# Patient Record
Sex: Female | Born: 1985
Health system: Southern US, Community
[De-identification: ages and names within clinical notes are randomized; demographics above are authoritative.]

## PROBLEM LIST (undated history)

## (undated) DIAGNOSIS — Z87442 Personal history of urinary calculi: Secondary | ICD-10-CM

## (undated) DIAGNOSIS — F419 Anxiety disorder, unspecified: Secondary | ICD-10-CM

## (undated) DIAGNOSIS — B009 Herpesviral infection, unspecified: Secondary | ICD-10-CM

## (undated) DIAGNOSIS — F32A Depression, unspecified: Secondary | ICD-10-CM

## (undated) DIAGNOSIS — E039 Hypothyroidism, unspecified: Secondary | ICD-10-CM

## (undated) DIAGNOSIS — N189 Chronic kidney disease, unspecified: Secondary | ICD-10-CM

## (undated) DIAGNOSIS — E162 Hypoglycemia, unspecified: Secondary | ICD-10-CM

## (undated) DIAGNOSIS — E785 Hyperlipidemia, unspecified: Secondary | ICD-10-CM

## (undated) DIAGNOSIS — D219 Benign neoplasm of connective and other soft tissue, unspecified: Secondary | ICD-10-CM

## (undated) HISTORY — PX: OTHER SURGICAL HISTORY: SHX169

## (undated) HISTORY — DX: Hyperlipidemia, unspecified: E78.5

## (undated) HISTORY — PX: LEEP: SHX91

## (undated) HISTORY — DX: Hypoglycemia, unspecified: E16.2

## (undated) NOTE — *Deleted (*Deleted)
    Ariel PANGILINAN  09/27/2020      Your procedure is scheduled on  Report to [facility]  at  A.M.  Call this number if you have problems the morning of surgery:[phone]  OUR ADDRESS IS [address], WE ARE LOCATED IN THE [place] [place] WITH [facility].   Remember:  Do not eat food or drink liquids after midnight.  Take these medicines the morning of surgery with A SIP OF WATER   Do not wear jewelry, make-up or nail polish.  Do not wear lotions, powders, or perfumes, or deoderant.  Do not shave 48 hours prior to surgery.  Men may shave face and neck.  Do not bring valuables to the hospital.  [facility] is not responsible for any belongings or valuables.  Contacts, dentures or bridgework may not be worn into surgery.  Leave your suitcase in the car.  After surgery it may be brought to your room.  For patients admitted to the hospital, discharge time will be determined by your treatment team.  Patients discharged the day of surgery will not be allowed to drive home.   Special instructions:  ***  Please read over the following fact sheets that you were given:

---
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
## 2004-01-26 ENCOUNTER — Other Ambulatory Visit: Admission: RE | Admit: 2004-01-26 | Discharge: 2004-01-26 | Payer: Self-pay | Admitting: Obstetrics and Gynecology

## 2005-06-01 ENCOUNTER — Emergency Department (HOSPITAL_COMMUNITY): Admission: EM | Admit: 2005-06-01 | Discharge: 2005-06-01 | Payer: Self-pay | Admitting: Family Medicine

## 2005-11-05 ENCOUNTER — Emergency Department (HOSPITAL_COMMUNITY): Admission: EM | Admit: 2005-11-05 | Discharge: 2005-11-05 | Payer: Self-pay | Admitting: Family Medicine

## 2006-04-20 ENCOUNTER — Emergency Department (HOSPITAL_COMMUNITY): Admission: EM | Admit: 2006-04-20 | Discharge: 2006-04-20 | Payer: Self-pay | Admitting: Family Medicine

## 2006-11-08 ENCOUNTER — Emergency Department (HOSPITAL_COMMUNITY): Admission: EM | Admit: 2006-11-08 | Discharge: 2006-11-08 | Payer: Self-pay | Admitting: Emergency Medicine

## 2015-05-17 ENCOUNTER — Telehealth: Payer: Self-pay | Admitting: Internal Medicine

## 2015-05-17 NOTE — Telephone Encounter (Signed)
Received records from [facility] for appointment with Dr Debara Pickett on 07/05/15.  Records given to [place] [facility] (medical records) for Dr Lysbeth Penner schedule on 07/05/15. lp

## 2015-07-05 ENCOUNTER — Ambulatory Visit (INDEPENDENT_AMBULATORY_CARE_PROVIDER_SITE_OTHER): Payer: BLUE CROSS/BLUE SHIELD | Admitting: Internal Medicine

## 2015-07-05 VITALS — BP 124/92 | HR 71 | Ht 62.0 in | Wt 137.1 lb

## 2015-07-05 DIAGNOSIS — E785 Hyperlipidemia, unspecified: Secondary | ICD-10-CM

## 2015-07-05 MED ORDER — OMEGA-3-ACID ETHYL ESTERS 1 G PO CAPS: 2.0000 g | ORAL_CAPSULE | Freq: Two times a day (BID) | ORAL | Status: DC

## 2015-07-05 NOTE — Patient Instructions (Signed)
Your physician has recommended you make the following change in your medication: START lovaza 2 capsules twice daily.   >> if you start on this medication, it is recommended that your PCP recheck your cholesterol in about 3 months  Your physician recommends that you schedule a follow-up appointment as needed.   Food Choices to Lower Your Triglycerides  Triglycerides are a type of fat in your blood. High levels of triglycerides can increase the risk of heart disease and stroke. If your triglyceride levels are high, the foods you eat and your eating habits are very important. Choosing the right foods can help lower your triglycerides.  WHAT GENERAL GUIDELINES DO I NEED TO FOLLOW?  Lose weight if you are overweight.   Limit or avoid alcohol.   Fill one half of your plate with vegetables and green salads.   Limit fruit to two servings a day. Choose fruit instead of juice.   Make one fourth of your plate whole grains. Look for the word "whole" as the first word in the ingredient list.  Fill one fourth of your plate with lean protein foods.  Enjoy fatty fish (such as salmon, mackerel, sardines, and tuna) three times a week.   Choose healthy fats.   Limit foods high in starch and sugar.  Eat more home-cooked food and less restaurant, buffet, and fast food.  Limit fried foods.  Cook foods using methods other than frying.  Limit saturated fats.  Check ingredient lists to avoid foods with partially hydrogenated oils (trans fats) in them. WHAT FOODS CAN I EAT?  Grains Whole grains, such as whole wheat or whole grain breads, crackers, cereals, and pasta. Unsweetened oatmeal, bulgur, barley, quinoa, or brown rice. Corn or whole wheat flour tortillas.  Vegetables Fresh or frozen vegetables (raw, steamed, roasted, or grilled). Green salads. Fruits All fresh, canned (in natural juice), or frozen fruits. Meat and Other Protein Products Ground beef (85% or leaner), grass-fed beef, or  beef trimmed of fat. Skinless chicken or [place]. Ground chicken or [place]. Pork trimmed of fat. All fish and seafood. Eggs. Dried beans, peas, or lentils. Unsalted nuts or seeds. Unsalted canned or dry beans. Dairy Low-fat dairy products, such as skim or 1% milk, 2% or reduced-fat cheeses, low-fat ricotta or cottage cheese, or plain low-fat yogurt. Fats and Oils Tub margarines without trans fats. Light or reduced-fat mayonnaise and salad dressings. Avocado. Safflower, olive, or canola oils. Natural peanut or almond butter. The items listed above may not be a complete list of recommended foods or beverages. Contact your dietitian for more options. WHAT FOODS ARE NOT RECOMMENDED?  Grains White bread. White pasta. White rice. Cornbread. Bagels, pastries, and croissants. Crackers that contain trans fat. Vegetables White potatoes. Corn. Creamed or fried vegetables. Vegetables in a cheese sauce. Fruits Dried fruits. Canned fruit in light or heavy syrup. Fruit juice. Meat and Other Protein Products Fatty cuts of meat. Ribs, chicken wings, bacon, sausage, bologna, salami, chitterlings, fatback, hot dogs, bratwurst, and packaged luncheon meats. Dairy Whole or 2% milk, cream, half-and-half, and cream cheese. Whole-fat or sweetened yogurt. Full-fat cheeses. Nondairy creamers and whipped toppings. Processed cheese, cheese spreads, or cheese curds. Sweets and Desserts Corn syrup, sugars, honey, and molasses. Candy. Jam and jelly. Syrup. Sweetened cereals. Cookies, pies, cakes, donuts, muffins, and ice cream. Fats and Oils Butter, stick margarine, lard, shortening, ghee, or bacon fat. Coconut, palm kernel, or palm oils. Beverages Alcohol. Sweetened drinks (such as sodas, lemonade, and fruit drinks or punches). The items listed  above may not be a complete list of foods and beverages to avoid. Contact your dietitian for more information. Document Released: 08/14/2004 Document Revised: 11/01/2013 Document  Reviewed: 08/31/2013 [facility] Patient Information 2015 [place], [place]. This information is not intended to replace advice given to you by your health care provider. Make sure you discuss any questions you have with your health care provider.

## 2015-07-06 ENCOUNTER — Encounter: Payer: Self-pay | Admitting: Internal Medicine

## 2015-07-06 DIAGNOSIS — E785 Hyperlipidemia, unspecified: Secondary | ICD-10-CM | POA: Insufficient documentation

## 2015-07-06 NOTE — Progress Notes (Deleted)
   Patient ID: Ariel [name], female    DOB: Apr 03, 1986, 29 y.o.   MRN: [id number]  HPI    Review of Systems    Physical Exam

## 2015-07-06 NOTE — Progress Notes (Signed)
    OFFICE NOTE  Chief Complaint:  Evaluate elevated cholesterol  Primary Care Physician: Marvene Staff, MD  HPI:  Ariel [name] is a pleasant 29 yo female with no significant medical problems. Her father had CAD, but ultimately died from metastatic liver cancer. His EF declined to 15% which contributed. She is concerned about family history and recently had a screening cholesterol test which showed elevated triglycerides. TC was 203, TG was 238, HDL 44 and LDL 111. This was a fasting study. She denies chest pain, shortness of breath ,palpitations or other associated symptoms. She is not very physically active but thin. She is a smoker and moderate alcohol user.  PMHx:  Past Medical History  Diagnosis Date  . Hyperlipidemia   . Hypoglycemia     History reviewed. No pertinent past surgical history.  FAMHx:  Family History  Problem Relation Age of Onset  . Cancer Father   . Heart failure Father   . Stroke Maternal Grandmother   . Cancer Maternal Grandfather   . Heart attack Paternal Grandfather     SOCHx:   reports that she has been smoking.  She does not have any smokeless tobacco history on file. She reports that she drinks alcohol. She reports that she does not use illicit drugs.  ALLERGIES:  No Known Allergies  ROS: A comprehensive review of systems was negative.  HOME MEDS: Current Outpatient Prescriptions  Medication Sig Dispense Refill  . ALPRAZolam (XANAX) 0.5 MG tablet Take 1 tablet by mouth as needed.  0  . omega-3 acid ethyl esters (LOVAZA) 1 G capsule Take 2 capsules (2 g total) by mouth 2 (two) times daily. 120 capsule 4  . TRI-PREVIFEM 0.18/0.215/0.25 MG-35 MCG tablet Take 1 tablet by mouth daily.  10  . valACYclovir (VALTREX) 1000 MG tablet Take 1,000 mg by mouth 2 (two) times daily as needed.  3   No current facility-administered medications for this visit.    LABS/IMAGING: No results found for this or any previous visit (from the past 48  hour(s)). No results found.  WEIGHTS: Wt Readings from Last 3 Encounters:  07/05/15 137 lb 1.6 oz (62.188 kg)    VITALS: BP 124/92 mmHg  Pulse 71  Ht 5\' 2"  (1.575 m)  Wt 137 lb 1.6 oz (62.188 kg)  BMI 25.07 kg/m2  EXAM: General appearance: alert and no distress Neck: no carotid bruit and no JVD Lungs: clear to auscultation bilaterally Heart: regular rate and rhythm, S1, S2 normal, no murmur, click, rub or gallop Abdomen: soft, non-tender; bowel sounds normal; no masses,  no organomegaly Extremities: extremities normal, atraumatic, no cyanosis or edema Pulses: 2+ and symmetric Skin: Skin color, texture, turgor normal. No rashes or lesions Neurologic: Grossly normal Psych: Pleasant  EKG: NSR with sinus arrhythmia at 71  ASSESSMENT: 1. Dyslipidemia - specifically, elevated triglycerides  PLAN: 1.   Ms. Mullen has a family history of CAD, but no other significant risk factors. Her triglycerides are elevated. She would benefit from increasing fish in her diet, getting more exercise and discontinuing alcohol for starters. She may wish to add concentrated omega-3's to her diet. The best option may be lovaza, which recently became generic. I will give her an Rx for this. She should have repeat lipid testing in 3 months.  Thanks for the kind referral.  Pixie Casino, MD, [facility] Attending Cardiologist [place] 07/06/2015, 5:29 PM

## 2015-08-01 NOTE — Addendum Note (Signed)
Addended by: Diana Eves on: 08/01/2015 01:45 PM   Modules accepted: Orders

## 2016-02-11 DIAGNOSIS — Z114 Encounter for screening for human immunodeficiency virus [HIV]: Secondary | ICD-10-CM | POA: Diagnosis not present

## 2016-02-11 DIAGNOSIS — Z6825 Body mass index (BMI) 25.0-25.9, adult: Secondary | ICD-10-CM | POA: Diagnosis not present

## 2016-02-11 DIAGNOSIS — Z01419 Encounter for gynecological examination (general) (routine) without abnormal findings: Secondary | ICD-10-CM | POA: Diagnosis not present

## 2016-02-11 DIAGNOSIS — Z1159 Encounter for screening for other viral diseases: Secondary | ICD-10-CM | POA: Diagnosis not present

## 2016-02-11 DIAGNOSIS — Z1151 Encounter for screening for human papillomavirus (HPV): Secondary | ICD-10-CM | POA: Diagnosis not present

## 2016-02-11 DIAGNOSIS — Z113 Encounter for screening for infections with a predominantly sexual mode of transmission: Secondary | ICD-10-CM | POA: Diagnosis not present

## 2016-03-12 ENCOUNTER — Other Ambulatory Visit: Payer: Self-pay | Admitting: Internal Medicine

## 2016-03-12 NOTE — Telephone Encounter (Signed)
Medication refused, pt should get medication filled with PCP

## 2016-05-06 ENCOUNTER — Other Ambulatory Visit: Payer: Self-pay | Admitting: Internal Medicine

## 2016-11-10 HISTORY — PX: REFRACTIVE SURGERY: SHX103

## 2016-11-21 ENCOUNTER — Other Ambulatory Visit: Payer: Self-pay | Admitting: Internal Medicine

## 2016-12-17 ENCOUNTER — Other Ambulatory Visit: Payer: Self-pay | Admitting: Internal Medicine

## 2016-12-31 DIAGNOSIS — M67431 Ganglion, right wrist: Secondary | ICD-10-CM | POA: Diagnosis not present

## 2017-01-02 ENCOUNTER — Other Ambulatory Visit: Payer: Self-pay | Admitting: Orthopedic Surgery

## 2017-01-02 DIAGNOSIS — IMO0002 Reserved for concepts with insufficient information to code with codable children: Secondary | ICD-10-CM

## 2017-01-02 DIAGNOSIS — R229 Localized swelling, mass and lump, unspecified: Principal | ICD-10-CM

## 2017-01-08 ENCOUNTER — Ambulatory Visit (INDEPENDENT_AMBULATORY_CARE_PROVIDER_SITE_OTHER): Payer: BLUE CROSS/BLUE SHIELD | Admitting: Internal Medicine

## 2017-01-08 ENCOUNTER — Encounter: Payer: Self-pay | Admitting: Internal Medicine

## 2017-01-08 VITALS — BP 118/62 | HR 63 | Ht 62.0 in | Wt 141.4 lb

## 2017-01-08 DIAGNOSIS — E785 Hyperlipidemia, unspecified: Secondary | ICD-10-CM | POA: Diagnosis not present

## 2017-01-08 MED ORDER — OMEGA-3-ACID ETHYL ESTERS 1 G PO CAPS: 2.0000 | ORAL_CAPSULE | Freq: Two times a day (BID) | ORAL | 3 refills | Status: DC

## 2017-01-08 NOTE — Patient Instructions (Signed)
Please have your primary care provider send your lab results to Dr. Debara Pickett  Fax: [phone]  Your physician wants you to follow-up in: ONE YEAR with Dr. Debara Pickett. You will receive a reminder letter in the mail two months in advance. If you don't receive a letter, please call our office to schedule the follow-up appointment.

## 2017-01-08 NOTE — Progress Notes (Signed)
OFFICE NOTE  Chief Complaint:  Evaluate elevated cholesterol  Primary Care Physician: Marvene Staff, MD  HPI:  Ariel [name] is a pleasant 31 yo female with no significant medical problems. Her father had CAD, but ultimately died from metastatic liver cancer. His EF declined to 15% which contributed. She is concerned about family history and recently had a screening cholesterol test which showed elevated triglycerides. TC was 203, TG was 238, HDL 44 and LDL 111. This was a fasting study. She denies chest pain, shortness of breath ,palpitations or other associated symptoms. She is not very physically active but thin. She is a smoker and moderate alcohol user.  01/08/2017  Ariel [name] returns today for follow-up. Since I last saw her she is doing quite well. She denies any chest pain or worsening shortness of breath. She did have elevated triglycerides primarily in the past to think is mostly related to diet however I recommended adding Lovaza to her regimen as well as increasing fish and improving exercise and diet. She is also smoker and had moderate alcohol use. She has cut back on that and actually given up alcohol for LAD. She's not had a repeat lipid profile in some time but has an appointment coming up with her PCP in April. She says she will have her lipid profile drawn at that time.  PMHx:  Past Medical History:  Diagnosis Date  . Hyperlipidemia   . Hypoglycemia     No past surgical history on file.  FAMHx:  Family History  Problem Relation Age of Onset  . Cancer Father   . Heart failure Father   . Stroke Maternal Grandmother   . Cancer Maternal Grandfather   . Heart attack Paternal Grandfather     SOCHx:   reports that she has been smoking.  She has a 2.00 pack-year smoking history. She does not have any smokeless tobacco history on file. She reports that she drinks alcohol. She reports that she does not use drugs.  ALLERGIES:  No Known Allergies  ROS: A  comprehensive review of systems was negative.  HOME MEDS: Current Outpatient Prescriptions  Medication Sig Dispense Refill  . ALPRAZolam (XANAX) 0.5 MG tablet Take 1 tablet by mouth as needed.  0  . omega-3 acid ethyl esters (LOVAZA) 1 g capsule TAKE 2 CAPSULES TWICE A DAY 120 capsule 4  . TRI-PREVIFEM 0.18/0.215/0.25 MG-35 MCG tablet Take 1 tablet by mouth daily.  10  . valACYclovir (VALTREX) 1000 MG tablet Take 1,000 mg by mouth 2 (two) times daily as needed.  3   No current facility-administered medications for this visit.     LABS/IMAGING: No results found for this or any previous visit (from the past 48 hour(s)). No results found.  WEIGHTS: Wt Readings from Last 3 Encounters:  01/08/17 141 lb 6.4 oz (64.1 kg)  07/05/15 137 lb 1.6 oz (62.2 kg)    VITALS: BP 118/62 (BP Location: Left Arm, Patient Position: Sitting, Cuff Size: Normal)   Pulse 63   Ht 5\' 2"  (1.575 m)   Wt 141 lb 6.4 oz (64.1 kg)   BMI 25.86 kg/m   EXAM: General appearance: alert and no distress Neck: no carotid bruit and no JVD Lungs: clear to auscultation bilaterally Heart: regular rate and rhythm, S1, S2 normal, no murmur, click, rub or gallop Abdomen: soft, non-tender; bowel sounds normal; no masses,  no organomegaly Extremities: extremities normal, atraumatic, no cyanosis or edema Pulses: 2+ and symmetric Skin: Skin color, texture, turgor normal. No rashes  or lesions Neurologic: Grossly normal Psych: Pleasant  EKG: NSR with sinus arrhythmia at 63  ASSESSMENT: 1. Dyslipidemia - specifically, elevated triglycerides  PLAN: 1.   Ms. Snuggs seems to be doing well and is managed to cut back on alcohol somewhat and continues to smoke socially. She says that she can work on quitting that as well. I've encouraged her to get more activity. She does like outdoor activities but is not done much over the winter. She's due for repeat lipid profile but wants to wait until she sees her PCP to get that. I  will review it and make any suggestions for altering her regimen based on those findings.   Follow-up annually.  Pixie Casino, MD, [facility] Attending Cardiologist [place] Graesyn Schreifels 01/08/2017, 5:07 PM

## 2017-01-19 ENCOUNTER — Inpatient Hospital Stay
Admission: RE | Admit: 2017-01-19 | Discharge: 2017-01-19 | Disposition: A | Discharge status: Home / Self Care | Payer: BLUE CROSS/BLUE SHIELD | Source: Ambulatory Visit | Attending: Orthopedic Surgery | Admitting: Orthopedic Surgery

## 2017-01-19 ENCOUNTER — Other Ambulatory Visit: Payer: BLUE CROSS/BLUE SHIELD

## 2017-01-23 ENCOUNTER — Ambulatory Visit
Admission: RE | Admit: 2017-01-23 | Discharge: 2017-01-23 | Disposition: A | Discharge status: Home / Self Care | Payer: BLUE CROSS/BLUE SHIELD | Source: Ambulatory Visit | Attending: Orthopedic Surgery | Admitting: Orthopedic Surgery

## 2017-01-23 ENCOUNTER — Encounter: Payer: Self-pay | Admitting: Radiology

## 2017-01-23 DIAGNOSIS — M25531 Pain in right wrist: Secondary | ICD-10-CM | POA: Diagnosis not present

## 2017-01-23 DIAGNOSIS — M67431 Ganglion, right wrist: Secondary | ICD-10-CM | POA: Diagnosis not present

## 2017-01-23 DIAGNOSIS — R229 Localized swelling, mass and lump, unspecified: Principal | ICD-10-CM

## 2017-01-23 DIAGNOSIS — IMO0002 Reserved for concepts with insufficient information to code with codable children: Secondary | ICD-10-CM

## 2017-01-23 MED ORDER — IOPAMIDOL (ISOVUE-M 200) INJECTION 41%: 3.0000 mL | Freq: Once | INTRAMUSCULAR | Status: DC

## 2017-01-28 DIAGNOSIS — M67431 Ganglion, right wrist: Secondary | ICD-10-CM | POA: Diagnosis not present

## 2017-01-29 ENCOUNTER — Other Ambulatory Visit: Payer: Self-pay | Admitting: Orthopedic Surgery

## 2017-02-04 ENCOUNTER — Other Ambulatory Visit: Payer: BLUE CROSS/BLUE SHIELD

## 2017-04-15 ENCOUNTER — Encounter (HOSPITAL_BASED_OUTPATIENT_CLINIC_OR_DEPARTMENT_OTHER): Payer: Self-pay | Admitting: *Deleted

## 2017-04-21 ENCOUNTER — Ambulatory Visit (HOSPITAL_BASED_OUTPATIENT_CLINIC_OR_DEPARTMENT_OTHER): Payer: BLUE CROSS/BLUE SHIELD | Admitting: Anesthesiology

## 2017-04-21 ENCOUNTER — Ambulatory Visit (HOSPITAL_BASED_OUTPATIENT_CLINIC_OR_DEPARTMENT_OTHER)
Admission: RE | Admit: 2017-04-21 | Discharge: 2017-04-21 | Disposition: A | Discharge status: Home / Self Care | Payer: BLUE CROSS/BLUE SHIELD | Source: Ambulatory Visit | Attending: Orthopedic Surgery | Admitting: Orthopedic Surgery

## 2017-04-21 ENCOUNTER — Encounter (HOSPITAL_BASED_OUTPATIENT_CLINIC_OR_DEPARTMENT_OTHER)
Admission: RE | Disposition: A | Discharge status: Home / Self Care | Payer: Self-pay | Source: Ambulatory Visit | Attending: Orthopedic Surgery

## 2017-04-21 ENCOUNTER — Encounter (HOSPITAL_BASED_OUTPATIENT_CLINIC_OR_DEPARTMENT_OTHER): Payer: Self-pay | Admitting: Certified Registered"

## 2017-04-21 DIAGNOSIS — M67431 Ganglion, right wrist: Secondary | ICD-10-CM | POA: Diagnosis not present

## 2017-04-21 DIAGNOSIS — G8918 Other acute postprocedural pain: Secondary | ICD-10-CM | POA: Diagnosis not present

## 2017-04-21 DIAGNOSIS — Z5333 Arthroscopic surgical procedure converted to open procedure: Secondary | ICD-10-CM | POA: Diagnosis not present

## 2017-04-21 DIAGNOSIS — F17210 Nicotine dependence, cigarettes, uncomplicated: Secondary | ICD-10-CM | POA: Insufficient documentation

## 2017-04-21 DIAGNOSIS — E785 Hyperlipidemia, unspecified: Secondary | ICD-10-CM | POA: Diagnosis not present

## 2017-04-21 DIAGNOSIS — R2231 Localized swelling, mass and lump, right upper limb: Secondary | ICD-10-CM | POA: Diagnosis not present

## 2017-04-21 HISTORY — DX: Anxiety disorder, unspecified: F41.9

## 2017-04-21 HISTORY — PX: GANGLION CYST EXCISION: SHX1691

## 2017-04-21 HISTORY — PX: WRIST ARTHROSCOPY WITH DEBRIDEMENT: SHX6194

## 2017-04-21 SURGERY — WRIST ARTHROSCOPY WITH DEBRIDEMENT
Anesthesia: General | Site: Wrist | Laterality: Right

## 2017-04-21 MED ORDER — ONDANSETRON HCL 4 MG/2ML IJ SOLN
INTRAMUSCULAR | Status: AC
  Filled 2017-04-21: qty 12

## 2017-04-21 MED ORDER — PROPOFOL 500 MG/50ML IV EMUL
INTRAVENOUS | Status: AC
  Filled 2017-04-21: qty 100

## 2017-04-21 MED ORDER — PROPOFOL 10 MG/ML IV BOLUS
INTRAVENOUS | Status: DC | PRN
  Administered 2017-04-21: 200 mg via INTRAVENOUS

## 2017-04-21 MED ORDER — ONDANSETRON HCL 4 MG/2ML IJ SOLN: 4.0000 mg | Freq: Four times a day (QID) | INTRAMUSCULAR | Status: DC | PRN

## 2017-04-21 MED ORDER — ONDANSETRON HCL 4 MG/2ML IJ SOLN
INTRAMUSCULAR | Status: AC
  Filled 2017-04-21: qty 2

## 2017-04-21 MED ORDER — MIDAZOLAM HCL 2 MG/2ML IJ SOLN
1.0000 mg | INTRAMUSCULAR | Status: DC | PRN
  Administered 2017-04-21: 2 mg via INTRAVENOUS

## 2017-04-21 MED ORDER — LACTATED RINGERS IV SOLN
INTRAVENOUS | Status: DC
  Administered 2017-04-21 (×2): via INTRAVENOUS

## 2017-04-21 MED ORDER — FENTANYL CITRATE (PF) 100 MCG/2ML IJ SOLN
50.0000 ug | INTRAMUSCULAR | Status: DC | PRN
  Administered 2017-04-21: 100 ug via INTRAVENOUS

## 2017-04-21 MED ORDER — FENTANYL CITRATE (PF) 100 MCG/2ML IJ SOLN
INTRAMUSCULAR | Status: AC
  Filled 2017-04-21: qty 2

## 2017-04-21 MED ORDER — OXYCODONE HCL 5 MG/5ML PO SOLN: 5.0000 mg | Freq: Once | ORAL | Status: DC | PRN

## 2017-04-21 MED ORDER — LIDOCAINE 2% (20 MG/ML) 5 ML SYRINGE
INTRAMUSCULAR | Status: AC
  Filled 2017-04-21: qty 15

## 2017-04-21 MED ORDER — BUPIVACAINE-EPINEPHRINE (PF) 0.5% -1:200000 IJ SOLN
INTRAMUSCULAR | Status: DC | PRN
  Administered 2017-04-21: 30 mL via PERINEURAL

## 2017-04-21 MED ORDER — CEFAZOLIN SODIUM-DEXTROSE 2-4 GM/100ML-% IV SOLN
2.0000 g | INTRAVENOUS | Status: AC
  Administered 2017-04-21: 2 g via INTRAVENOUS

## 2017-04-21 MED ORDER — CEFAZOLIN SODIUM-DEXTROSE 2-4 GM/100ML-% IV SOLN
INTRAVENOUS | Status: AC
  Filled 2017-04-21: qty 100

## 2017-04-21 MED ORDER — LIDOCAINE HCL (CARDIAC) 20 MG/ML IV SOLN
INTRAVENOUS | Status: DC | PRN
  Administered 2017-04-21: 30 mg via INTRAVENOUS

## 2017-04-21 MED ORDER — FENTANYL CITRATE (PF) 100 MCG/2ML IJ SOLN: 25.0000 ug | INTRAMUSCULAR | Status: DC | PRN

## 2017-04-21 MED ORDER — DEXAMETHASONE SODIUM PHOSPHATE 10 MG/ML IJ SOLN
INTRAMUSCULAR | Status: AC
  Filled 2017-04-21: qty 2

## 2017-04-21 MED ORDER — CHLORHEXIDINE GLUCONATE 4 % EX LIQD: 60.0000 mL | Freq: Once | CUTANEOUS | Status: DC

## 2017-04-21 MED ORDER — SCOPOLAMINE 1 MG/3DAYS TD PT72: 1.0000 | MEDICATED_PATCH | Freq: Once | TRANSDERMAL | Status: DC | PRN

## 2017-04-21 MED ORDER — HYDROCODONE-ACETAMINOPHEN 5-325 MG PO TABS: 1.0000 | ORAL_TABLET | Freq: Four times a day (QID) | ORAL | 0 refills | Status: DC | PRN

## 2017-04-21 MED ORDER — MIDAZOLAM HCL 2 MG/2ML IJ SOLN
INTRAMUSCULAR | Status: AC
  Filled 2017-04-21: qty 2

## 2017-04-21 MED ORDER — DEXAMETHASONE SODIUM PHOSPHATE 10 MG/ML IJ SOLN
INTRAMUSCULAR | Status: DC | PRN
  Administered 2017-04-21: 10 mg via INTRAVENOUS

## 2017-04-21 MED ORDER — ONDANSETRON HCL 4 MG/2ML IJ SOLN
INTRAMUSCULAR | Status: DC | PRN
  Administered 2017-04-21: 4 mg via INTRAVENOUS

## 2017-04-21 MED ORDER — OXYCODONE HCL 5 MG PO TABS: 5.0000 mg | ORAL_TABLET | Freq: Once | ORAL | Status: DC | PRN

## 2017-04-21 SURGICAL SUPPLY — 82 items
BLADE CUDA 2.0 (BLADE) IMPLANT
BLADE EAR TYMPAN 2.5 60D BEAV (BLADE) IMPLANT
BLADE MINI RND TIP GREEN BEAV (BLADE) IMPLANT
BLADE SURG 15 STRL LF DISP TIS (BLADE) ×2 IMPLANT
BLADE SURG 15 STRL SS (BLADE) ×3
BNDG CMPR 9X4 STRL LF SNTH (GAUZE/BANDAGES/DRESSINGS) ×2
BNDG COHESIVE 3X5 TAN STRL LF (GAUZE/BANDAGES/DRESSINGS) ×3 IMPLANT
BNDG ESMARK 4X9 LF (GAUZE/BANDAGES/DRESSINGS) ×2 IMPLANT
BNDG GAUZE ELAST 4 BULKY (GAUZE/BANDAGES/DRESSINGS) ×3 IMPLANT
BUR CUDA 2.9 (BURR) ×2 IMPLANT
BUR FULL RADIUS 2.0 (BURR) ×2 IMPLANT
BUR FULL RADIUS 2.9 (BURR) IMPLANT
BUR GATOR 2.9 (BURR) IMPLANT
BUR SPHERICAL 2.9 (BURR) IMPLANT
CANISTER SUCT 1200ML W/VALVE (MISCELLANEOUS) IMPLANT
CHLORAPREP W/TINT 26ML (MISCELLANEOUS) ×3 IMPLANT
CORDS BIPOLAR (ELECTRODE) ×3 IMPLANT
COVER BACK TABLE 60X90IN (DRAPES) ×3 IMPLANT
COVER MAYO STAND STRL (DRAPES) ×3 IMPLANT
CUFF TOURNIQUET SINGLE 18IN (TOURNIQUET CUFF) ×2 IMPLANT
DECANTER SPIKE VIAL GLASS SM (MISCELLANEOUS) IMPLANT
DRAPE EXTREMITY T 121X128X90 (DRAPE) ×3 IMPLANT
DRAPE IMP U-DRAPE 54X76 (DRAPES) ×3 IMPLANT
DRAPE OEC MINIVIEW 54X84 (DRAPES) IMPLANT
DRAPE SURG 17X23 STRL (DRAPES) ×3 IMPLANT
ELECT SMALL JOINT 90D BASC (ELECTRODE) IMPLANT
GAUZE SPONGE 4X4 12PLY STRL (GAUZE/BANDAGES/DRESSINGS) ×3 IMPLANT
GAUZE XEROFORM 1X8 LF (GAUZE/BANDAGES/DRESSINGS) ×3 IMPLANT
GLOVE BIO SURGEON STRL SZ7 (GLOVE) ×2 IMPLANT
GLOVE BIOGEL PI IND STRL 8.5 (GLOVE) ×2 IMPLANT
GLOVE BIOGEL PI INDICATOR 8.5 (GLOVE) ×1
GLOVE SURG ORTHO 8.0 STRL STRW (GLOVE) ×5 IMPLANT
GLOVE SURG SS PI 6.5 STRL IVOR (GLOVE) ×2 IMPLANT
GLOVE SURG SS PI 7.0 STRL IVOR (GLOVE) ×2 IMPLANT
GOWN STRL REUS W/ TWL LRG LVL3 (GOWN DISPOSABLE) ×3 IMPLANT
GOWN STRL REUS W/TWL LRG LVL3 (GOWN DISPOSABLE) ×6
GOWN STRL REUS W/TWL XL LVL3 (GOWN DISPOSABLE) ×3 IMPLANT
IV NS IRRIG 3000ML ARTHROMATIC (IV SOLUTION) ×3 IMPLANT
IV SET EXT 30 76VOL 4 MALE LL (IV SETS) ×3 IMPLANT
MANIFOLD NEPTUNE II (INSTRUMENTS) IMPLANT
NDL EPIDURAL TUOHY 20GX3.5 (NEEDLE) IMPLANT
NDL PRECISIONGLIDE 27X1.5 (NEEDLE) ×1 IMPLANT
NDL SAFETY ECLIPSE 18X1.5 (NEEDLE) ×4 IMPLANT
NDL SPNL 18GX3.5 QUINCKE PK (NEEDLE) IMPLANT
NEEDLE HYPO 18GX1.5 SHARP (NEEDLE) ×3
NEEDLE HYPO 22GX1.5 SAFETY (NEEDLE) ×3 IMPLANT
NEEDLE PRECISIONGLIDE 27X1.5 (NEEDLE) ×3 IMPLANT
NEEDLE SPNL 18GX3.5 QUINCKE PK (NEEDLE) ×3 IMPLANT
NEEDLE TUOHY 20GX3.5 (NEEDLE) IMPLANT
NS IRRIG 1000ML POUR BTL (IV SOLUTION) ×3 IMPLANT
PACK BASIN DAY SURGERY FS (CUSTOM PROCEDURE TRAY) ×3 IMPLANT
PAD CAST 3X4 CTTN HI CHSV (CAST SUPPLIES) ×2 IMPLANT
PADDING CAST ABS 3INX4YD NS (CAST SUPPLIES) ×1
PADDING CAST ABS 4INX4YD NS (CAST SUPPLIES) ×1
PADDING CAST ABS COTTON 3X4 (CAST SUPPLIES) ×2 IMPLANT
PADDING CAST ABS COTTON 4X4 ST (CAST SUPPLIES) ×2 IMPLANT
PADDING CAST COTTON 3X4 STRL (CAST SUPPLIES) ×3
PROBE BIPOLAR ARTHRO 85MM 30D (MISCELLANEOUS) IMPLANT
ROUTER HOODED VORTEX 2.9MM (BLADE) IMPLANT
SET ARTHROSCOPY TUBING (MISCELLANEOUS) ×3
SET ARTHROSCOPY TUBING LN (MISCELLANEOUS) ×1 IMPLANT
SET SM JOINT TUBING/CANN (CANNULA) IMPLANT
SLEEVE SCD COMPRESS KNEE MED (MISCELLANEOUS) ×2 IMPLANT
SLING ARM FOAM STRAP MED (SOFTGOODS) ×2 IMPLANT
SPLINT PLASTER CAST XFAST 3X15 (CAST SUPPLIES) ×10 IMPLANT
SPLINT PLASTER XTRA FASTSET 3X (CAST SUPPLIES) ×5
STOCKINETTE 4X48 STRL (DRAPES) ×3 IMPLANT
SUCTION FRAZIER HANDLE 10FR (MISCELLANEOUS)
SUCTION TUBE FRAZIER 10FR DISP (MISCELLANEOUS) IMPLANT
SUT ETHILON 4 0 PS 2 18 (SUTURE) ×3 IMPLANT
SUT MERSILENE 4 0 P 3 (SUTURE) IMPLANT
SUT PDS AB 2-0 CT2 27 (SUTURE) IMPLANT
SUT STEEL 4 0 (SUTURE) IMPLANT
SUT VIC AB 2-0 PS2 27 (SUTURE) IMPLANT
SUT VIC AB 4-0 P2 18 (SUTURE) ×2 IMPLANT
SUT VICRYL 4-0 PS2 18IN ABS (SUTURE) IMPLANT
SYR BULB 3OZ (MISCELLANEOUS) ×3 IMPLANT
SYR CONTROL 10ML LL (SYRINGE) ×3 IMPLANT
TOWEL OR 17X24 6PK STRL BLUE (TOWEL DISPOSABLE) ×6 IMPLANT
TUBE CONNECTING 20X1/4 (TUBING) ×2 IMPLANT
UNDERPAD 30X30 (UNDERPADS AND DIAPERS) ×1 IMPLANT
WATER STERILE IRR 1000ML POUR (IV SOLUTION) ×3 IMPLANT

## 2017-04-21 NOTE — Anesthesia Procedure Notes (Signed)
Procedure Name: LMA Insertion Date/Time: 04/21/2017 8:41 AM Performed by: Naesha Buckalew D Pre-anesthesia Checklist: Patient identified, Emergency Drugs available, Suction available and Patient being monitored Patient Re-evaluated:Patient Re-evaluated prior to inductionOxygen Delivery Method: Circle system utilized Preoxygenation: Pre-oxygenation with 100% oxygen Intubation Type: IV induction Ventilation: Mask ventilation without difficulty LMA: LMA inserted LMA Size: 3.0 Number of attempts: 1 Airway Equipment and Method: Bite block Placement Confirmation: positive ETCO2 Tube secured with: Tape Dental Injury: Teeth and Oropharynx as per pre-operative assessment

## 2017-04-21 NOTE — Anesthesia Preprocedure Evaluation (Addendum)
Anesthesia Evaluation  Patient identified by MRN, date of birth, ID band Patient awake    Reviewed: Allergy & Precautions, H&P , NPO status , Patient's Chart, lab work & pertinent test results  Airway Mallampati: II   Neck ROM: full    Dental   Pulmonary Current Smoker,    breath sounds clear to auscultation       Cardiovascular negative cardio ROS   Rhythm:regular Rate:Normal     Neuro/Psych Anxiety    GI/Hepatic   Endo/Other    Renal/GU      Musculoskeletal   Abdominal   Peds  Hematology   Anesthesia Other Findings   Reproductive/Obstetrics                            Anesthesia Physical Anesthesia Plan  ASA: II  Anesthesia Plan: General   Post-op Pain Management:  Regional for Post-op pain   Induction: Intravenous  PONV Risk Score and Plan: 2 and Ondansetron and Dexamethasone  Airway Management Planned: LMA  Additional Equipment:   Intra-op Plan:   Post-operative Plan:   Informed Consent: I have reviewed the patients History and Physical, chart, labs and discussed the procedure including the risks, benefits and alternatives for the proposed anesthesia with the patient or authorized representative who has indicated his/her understanding and acceptance.     Plan Discussed with: CRNA, Anesthesiologist and Surgeon  Anesthesia Plan Comments:         Anesthesia Quick Evaluation

## 2017-04-21 NOTE — Op Note (Signed)
NAMESHEENAH, [name]         ACCOUNT NO.:  [id number]  MEDICAL RECORD NO.:  [id number]  LOCATION:                                 FACILITY:  PHYSICIAN:  Daryll Brod, M.D.            DATE OF BIRTH:  DATE OF PROCEDURE:  04/21/2017 DATE OF DISCHARGE:                              OPERATIVE REPORT   PREOPERATIVE DIAGNOSES:  Dorsal ulnar wrist ganglion, right wrist.  POSTOPERATIVE DIAGNOSIS:  Dorsal ulnar wrist ganglion, right wrist.  OPERATION:  Arthroscopy with debridement of right wrist open excision, dorsal wrist ganglion.  SURGEON:  Daryll Brod, M.D.  ASSISTANT:  None.  ANESTHESIA:  Supraclavicular block with sedation.  PLACE OF SURGERY:  [facility].  ANESTHESIOLOGIST:  Albertha Ghee, MD.  HISTORY:  The patient is a 31 year old female with a large cyst on the dorsal ulnar aspect of her wrist.  MRI reveals this appears to come from the midcarpal joint.  She is admitted for excision with arthroscopic inspection of the joint, possible excision arthroscopically.  Pre, peri, and postoperative course have been discussed along with risks and complications.  She is aware that there is no guarantee to the surgery, THE possibility of infection; recurrence of injury to arteries, nerves, tendons; incomplete relief of symptoms; dystrophy.  In the preoperative area, the patient is seen, the extremity marked by both patient and surgeon.  Antibiotic given.  PROCEDURE IN DETAIL:  The patient was brought to the operating room. After a supraclavicular block was carried out in the preoperative area under the direction of Dr. Marcie Bal.  A sedation was then carried out by the Anesthesia Department.  She was prepped in a supine position with the right arm free.  A 3-minute dry time was allowed.  Time-out taken, confirming the patient and procedure.  Prep was done with ChloraPrep. The limb was placed in the arthroscopy tower and 10 pounds of traction applied.  The joint was then  inflated through the 3-4 portal.  A transverse incision made, deepened with a hemostat.  A blunt trocar was used to enter the joint.  The joint was inspected.  The volar radial wrist ligaments were intact and scapholunate ligament was intact.  There was no significant cartilage damage.  A friable tissue was present on the ulnar aspect into the joint from the dorsal portion.  An irrigation catheter, using the 18-gauge needle was placed in the 6-U portal.  A 4-5 portal was then opened proximal to the cystic mass.  A blunt trocar was used to enter the joint after spreading with a hemostat.  The scope was introduced ulnarly and inspection performed on the ulnar aspect.  The lunotriquetral joint appeared to be intact.  There was some abrasions dorsally and at the lunotriquetral ligament complex.  The scope was reintroduced in the 3-4 portal, a shaver introduced in the 4-5 portal and the cyst was not identified in the proximal joint.  The midcarpal joint was then inspected.  An inflation was performed through the radial midcarpal portal, but the scope was not easily able to be introduced and it was decided to proceed with an ulnar midcarpal portal.  This was localized with a 22-gauge needle.  An incision made distal to the mass. The scope was then introduced and the joint inspected, the cartilage showed no changes.  There was mild instability of the lunotriquetral joint.  There was no instability noted to the scapholunate joint.  Again the cyst exit was not able to be visualized with the scope.  After further debridement of the radial carpal joint, after introduction of the scope into the radial 3-4 portal and the shaver in the 4-5, again the cyst was not able to be identified.  The arm was then removed from the arthroscopy tower, and exsanguinated with an Esmarch bandage.  The tourniquet on the upper arm was inflated to 250 mmHg.  A transverse incision was made between the portal incisions.   This was deepened down to the retinaculum where the cyst was immediately encountered.  The dorsal sensory branch of the ulnar nerve was not seen in the wound.  The cyst was then isolated.  It was found to be extremely thick walled. This was excised and sent to Pathology, entering from the midportion of the midcarpal joint.  This area was opened and it was not repaired dorsally.  The wound was copiously irrigated with saline.  The subcutaneous tissue was closed with interrupted 4-0 Vicryl and the skin incisions with interrupted 4-0 nylon sutures.  A sterile compressive dressing and volar splint was applied.  On deflation of the tourniquet, all fingers immediately pinked.  She was taken to the recovery room for observation in satisfactory condition.  She will be discharged to home to return to the [facility] in 1 week, on Norco.          ______________________________ Daryll Brod, M.D.     GK/MEDQ  D:  04/21/2017  T:  04/21/2017  Job:  [id number]

## 2017-04-21 NOTE — Discharge Instructions (Addendum)
° °  ° ° ° °[facility] Instructions °Hand Surgery ° °Wound Care: °Keep your hand elevated above the level of your heart.  Do not allow it to dangle by your side.  Keep the dressing dry and do not remove it unless your doctor advises you to do so.  He will usually change it at the time of your post-op visit.  Moving your fingers is advised to stimulate circulation but will depend on the site of your surgery.  If you have a splint applied, your doctor will advise you regarding movement. ° °Activity: °Do not drive or operate machinery today.  Rest today and then you may return to your normal activity and work as indicated by your physician. ° °Diet:  °Drink liquids today or eat a light diet.  You may resume a regular diet tomorrow.   ° °General expectations: °Pain for two to three days. °Fingers may become slightly swollen. ° °Call your doctor if any of the following occur: °Severe pain not relieved by pain medication. °Elevated temperature. °Dressing soaked with blood. °Inability to move fingers. °White or bluish color to fingers. ° ° °Regional Anesthesia Blocks ° °1. Numbness or the inability to move the "blocked" extremity may last from 3-48 hours after placement. The length of time depends on the medication injected and your individual response to the medication. If the numbness is not going away after 48 hours, call your surgeon. ° °2. The extremity that is blocked will need to be protected until the numbness is gone and the  Strength has returned. Because you cannot feel it, you will need to take extra care to avoid injury. Because it may be weak, you may have difficulty moving it or using it. You may not know what position it is in without looking at it while the block is in effect. ° °3. For blocks in the legs and feet, returning to weight bearing and walking needs to be done carefully. You will need to wait until the numbness is entirely gone and the strength has returned. You should be able to move your leg  and foot normally before you try and bear weight or walk. You will need someone to be with you when you first try to ensure you do not fall and possibly risk injury. ° °4. Bruising and tenderness at the needle site are common side effects and will resolve in a few days. ° °5. Persistent numbness or new problems with movement should be communicated to the surgeon or the [facility] ([phone])/ [facility] ([phone]). ° ° ° °Post Anesthesia Home Care Instructions ° °Activity: °Get plenty of rest for the remainder of the day. A responsible individual must stay with you for 24 hours following the procedure.  °For the next 24 hours, DO NOT: °-Drive a car °-Operate machinery °-Drink alcoholic beverages °-Take any medication unless instructed by your physician °-Make any legal decisions or sign important papers. ° °Meals: °Start with liquid foods such as gelatin or soup. Progress to regular foods as tolerated. Avoid greasy, spicy, heavy foods. If nausea and/or vomiting occur, drink only clear liquids until the nausea and/or vomiting subsides. Call your physician if vomiting continues. ° °Special Instructions/Symptoms: °Your throat may feel dry or sore from the anesthesia or the breathing tube placed in your throat during surgery. If this causes discomfort, gargle with warm salt water. The discomfort should disappear within 24 hours. ° °If you had a scopolamine patch placed behind your ear for   the management of post- operative nausea and/or vomiting: ° °1. The medication in the patch is effective for 72 hours, after which it should be removed.  Wrap patch in a tissue and discard in the trash. Wash hands thoroughly with soap and water. °2. You may remove the patch earlier than 72 hours if you experience unpleasant side effects which may include dry mouth, dizziness or visual disturbances. °3. Avoid touching the patch. Wash your hands with soap and water after contact with the patch. °  ° ° °

## 2017-04-21 NOTE — Anesthesia Procedure Notes (Signed)
Anesthesia Regional Block: Supraclavicular block   Pre-Anesthetic Checklist: ,, timeout performed, Correct Patient, Correct Site, Correct Laterality, Correct Procedure, Correct Position, site marked, Risks and benefits discussed,  Surgical consent,  Pre-op evaluation,  At surgeon's request and post-op pain management  Laterality: Right  Prep: chloraprep       Needles:  Injection technique: Single-shot  Needle Type: Echogenic Stimulator Needle     Needle Length: 5cm  Needle Gauge: 22     Additional Needles:   Procedures: ultrasound guided, nerve stimulator,,,,,,   Nerve Stimulator or Paresthesia:  Response: biceps flexion, 0.45 mA,   Additional Responses:   Narrative:  Start time: 04/21/2017 8:10 AM End time: 04/21/2017 8:18 AM Injection made incrementally with aspirations every 5 mL.  Performed by: Personally  Anesthesiologist: Skyleigh Windle  Additional Notes: Functioning IV was confirmed and monitors were applied.  A 9mm 22ga Arrow echogenic stimulator needle was used. Sterile prep and drape,hand hygiene and sterile gloves were used.  Negative aspiration and negative test dose prior to incremental administration of local anesthetic. The patient tolerated the procedure well.  Ultrasound guidance: relevent anatomy identified, needle position confirmed, local anesthetic spread visualized around nerve(s), vascular puncture avoided.  Image printed for medical record.

## 2017-04-21 NOTE — Brief Op Note (Signed)
04/21/2017  9:49 AM  PATIENT:  Ariel [name]  31 y.o. female  PRE-OPERATIVE DIAGNOSIS:  CYST RIGHT WRIST  POST-OPERATIVE DIAGNOSIS:  CYST RIGHT WRIST  PROCEDURE:  Procedure(s): ARTHROSCOPY RIGHT WRIST with debridement (Right) OPEN EXCISION DORSAL ULNAR GANGLION RIGHT WRIST (Right)  SURGEON:  Surgeon(s) and Role:    * Daryll Brod, MD - Primary  PHYSICIAN ASSISTANT:   ASSISTANTS: none   ANESTHESIA:   regional and IV sedation  EBL:  Total I/O In: 1200 [I.V.:1200] Out: -   BLOOD ADMINISTERED:none  DRAINS: none   LOCAL MEDICATIONS USED:  NONE  SPECIMEN:  Excision  DISPOSITION OF SPECIMEN:  PATHOLOGY  COUNTS:  YES  TOURNIQUET:  * Missing tourniquet times found for documented tourniquets in log:  [id number] *  DICTATION: .Other Dictation: Dictation Number [id number]  PLAN OF CARE: Discharge to home after PACU  PATIENT DISPOSITION:  PACU - hemodynamically stable.

## 2017-04-21 NOTE — Op Note (Signed)
Dictation Number [id number]

## 2017-04-21 NOTE — Progress Notes (Signed)
Assisted Dr. Hodierne with right, ultrasound guided, interscalene  block. Side rails up, monitors on throughout procedure. See vital signs in flow sheet. Tolerated Procedure well. 

## 2017-04-21 NOTE — H&P (Signed)
  Ariel [name] is an 31 y.o. female.   Chief Complaint: mass right wrist  [name] is a 31 year old right left-hand dominant female  . She comes in with a complaint of a mass on the dorsal ulnar aspect of her right wrist. She states this been there for years. She states it will come and it will go she recalls no history of injury. It is all only mildly uncomfortable for her with a dull ache in nature with a VAS score of 1-2/10. Support causes some improvement in symptoms. Nothing seems to make it worse. 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 She has a history of borderline diabetes no history of thyroid problems arthritis or gout. Family history is positive diabetes negative for thyroid problems arthritis and gout. She is not taking anything for this. She states that occasionally she will use ibuprofen which does help Her MRI is reviewed with her. This is a cyst coming out of the midcarpal joint. Been going to the ulnar aspect. This on the dorsal side.      Past Medical History:  Diagnosis Date  . Anxiety   . Hyperlipidemia   . Hypoglycemia     Past Surgical History:  Procedure Laterality Date  . wisdom teeth extraction      Family History  Problem Relation Age of Onset  . Cancer Father   . Heart failure Father   . Stroke Maternal Grandmother   . Cancer Maternal Grandfather   . Heart attack Paternal Grandfather    Social History:  reports that she has been smoking Cigarettes.  She has a 2.00 pack-year smoking history. She has never used smokeless tobacco. She reports that she drinks alcohol. She reports that she uses drugs, including Marijuana.  Allergies: No Known Allergies  No prescriptions prior to admission.    No results found for this or any previous visit (from the past 48 hour(s)).  No results found.   Pertinent items are noted in HPI.  Height 5\' 2"  (1.575 m), weight 60.3 kg (133 lb), last  menstrual period 04/08/2017.  General appearance: alert, cooperative and appears stated age Head: Normocephalic, without obvious abnormality Neck: no JVD Resp: clear to auscultation bilaterally Cardio: regular rate and rhythm, S1, S2 normal, no murmur, click, rub or gallop GI: soft, non-tender; bowel sounds normal; no masses,  no organomegaly Extremities: mass right wrist Pulses: 2+ and symmetric Skin: Skin color, texture, turgor normal. No rashes or lesions Neurologic: Grossly normal Incision/Wound: na  Assessment/Plan Assessment:  1. Ganglion of right wrist    Plan: She is wondering about having this removed. If told her that this can be removed as an outpatient under regional anesthesia would recommend an arthroscopy of her wrist be certain nothing else is going on that is not visible on the MRI. Followed by an open resection. Is possible that we may be able to remove the cyst arthroscopically it is directly over the ulnar midcarpal portal. Pre-peri-and postoperative course are discussed along with risk applications. She is where there is no guarantee to the surgery possibility of infection recurrence injury to arteries nerves tendons incomplete relief symptoms and dystrophy. She is scheduled as an outpatient under regional anesthesia.      Ariel [name] R 04/21/2017, 4:01 AM

## 2017-04-21 NOTE — Anesthesia Postprocedure Evaluation (Signed)
Anesthesia Post Note  Patient: Ariel [name]  Procedure(s) Performed: Procedure(s) (LRB): ARTHROSCOPY RIGHT WRIST with debridement (Right) OPEN EXCISION DORSAL ULNAR GANGLION RIGHT WRIST (Right)     Patient location during evaluation: PACU Anesthesia Type: General and Regional Level of consciousness: awake and alert and patient cooperative Pain management: pain level controlled Vital Signs Assessment: post-procedure vital signs reviewed and stable Respiratory status: spontaneous breathing and respiratory function stable Cardiovascular status: stable Anesthetic complications: no    Last Vitals:  Vitals:   04/21/17 1015 04/21/17 1030  BP: 121/83 (!) 119/95  Pulse: 74 65  Resp: 11 17  Temp:      Last Pain:  Vitals:   04/21/17 1100  TempSrc:   PainSc: 0-No pain                 Rishi Vicario S

## 2017-04-21 NOTE — Transfer of Care (Signed)
Immediate Anesthesia Transfer of Care Note  Patient: Ariel [name]  Procedure(s) Performed: Procedure(s): ARTHROSCOPY RIGHT WRIST with debridement (Right) OPEN EXCISION DORSAL ULNAR GANGLION RIGHT WRIST (Right)  Patient Location: PACU  Anesthesia Type:GA combined with regional for post-op pain  Level of Consciousness: awake and patient cooperative  Airway & Oxygen Therapy: Patient Spontanous Breathing and Patient connected to face mask oxygen  Post-op Assessment: Report given to RN and Post -op Vital signs reviewed and stable  Post vital signs: Reviewed and stable  Last Vitals:  Vitals:   04/21/17 0820 04/21/17 0825  BP:    Pulse: 67 81  Resp: 15 (!) 25  Temp:      Last Pain:  Vitals:   04/21/17 0738  TempSrc: Oral         Complications: No apparent anesthesia complications

## 2017-04-22 ENCOUNTER — Encounter (HOSPITAL_BASED_OUTPATIENT_CLINIC_OR_DEPARTMENT_OTHER): Payer: Self-pay | Admitting: Orthopedic Surgery

## 2017-05-06 DIAGNOSIS — Z01419 Encounter for gynecological examination (general) (routine) without abnormal findings: Secondary | ICD-10-CM | POA: Diagnosis not present

## 2017-05-06 DIAGNOSIS — Z6824 Body mass index (BMI) 24.0-24.9, adult: Secondary | ICD-10-CM | POA: Diagnosis not present

## 2017-05-06 DIAGNOSIS — Z1151 Encounter for screening for human papillomavirus (HPV): Secondary | ICD-10-CM | POA: Diagnosis not present

## 2017-05-12 DIAGNOSIS — Z0000 Encounter for general adult medical examination without abnormal findings: Secondary | ICD-10-CM | POA: Diagnosis not present

## 2017-05-12 DIAGNOSIS — Z131 Encounter for screening for diabetes mellitus: Secondary | ICD-10-CM | POA: Diagnosis not present

## 2017-05-12 DIAGNOSIS — Z13220 Encounter for screening for lipoid disorders: Secondary | ICD-10-CM | POA: Diagnosis not present

## 2017-05-12 DIAGNOSIS — Z130 Encounter for screening for diseases of the blood and blood-forming organs and certain disorders involving the immune mechanism: Secondary | ICD-10-CM | POA: Diagnosis not present

## 2017-05-12 DIAGNOSIS — Z1329 Encounter for screening for other suspected endocrine disorder: Secondary | ICD-10-CM | POA: Diagnosis not present

## 2017-06-04 ENCOUNTER — Telehealth: Payer: Self-pay | Admitting: Internal Medicine

## 2017-06-04 NOTE — Telephone Encounter (Signed)
Returned call to patient. She had labs done at [facility] office recently and would like Dr. Debara Pickett to review. Per last visit in March 2018, MD requested that labs be faxed to him for review. Informed patient I would call her once MD has looked at labs.

## 2017-06-04 NOTE — Telephone Encounter (Signed)
New message     Pt was returning call regarding lab results

## 2017-06-05 NOTE — Telephone Encounter (Signed)
Left detailed message with MD recommendations (OK per DPR)

## 2017-06-05 NOTE — Telephone Encounter (Signed)
Triglycerides are almost normal (156- goal <150) - continue Lovaza and reduced alcohol intake, exercise, smoking cessation and exercise.  Dr. Debara Pickett

## 2017-09-11 DIAGNOSIS — Z9889 Other specified postprocedural states: Secondary | ICD-10-CM | POA: Diagnosis not present

## 2017-09-11 DIAGNOSIS — H04123 Dry eye syndrome of bilateral lacrimal glands: Secondary | ICD-10-CM | POA: Diagnosis not present

## 2018-03-16 ENCOUNTER — Other Ambulatory Visit: Payer: Self-pay | Admitting: Internal Medicine

## 2018-03-16 DIAGNOSIS — E785 Hyperlipidemia, unspecified: Secondary | ICD-10-CM

## 2018-06-24 ENCOUNTER — Other Ambulatory Visit: Payer: Self-pay | Admitting: Internal Medicine

## 2018-06-24 DIAGNOSIS — E785 Hyperlipidemia, unspecified: Secondary | ICD-10-CM

## 2018-06-24 NOTE — Telephone Encounter (Signed)
Rx request sent to pharmacy.  

## 2018-07-28 DIAGNOSIS — Z6824 Body mass index (BMI) 24.0-24.9, adult: Secondary | ICD-10-CM | POA: Diagnosis not present

## 2018-07-28 DIAGNOSIS — Z1151 Encounter for screening for human papillomavirus (HPV): Secondary | ICD-10-CM | POA: Diagnosis not present

## 2018-07-28 DIAGNOSIS — Z118 Encounter for screening for other infectious and parasitic diseases: Secondary | ICD-10-CM | POA: Diagnosis not present

## 2018-07-28 DIAGNOSIS — Z01419 Encounter for gynecological examination (general) (routine) without abnormal findings: Secondary | ICD-10-CM | POA: Diagnosis not present

## 2018-08-17 DIAGNOSIS — Z131 Encounter for screening for diabetes mellitus: Secondary | ICD-10-CM | POA: Diagnosis not present

## 2018-08-17 DIAGNOSIS — Z1159 Encounter for screening for other viral diseases: Secondary | ICD-10-CM | POA: Diagnosis not present

## 2018-08-17 DIAGNOSIS — Z0000 Encounter for general adult medical examination without abnormal findings: Secondary | ICD-10-CM | POA: Diagnosis not present

## 2018-08-17 DIAGNOSIS — Z130 Encounter for screening for diseases of the blood and blood-forming organs and certain disorders involving the immune mechanism: Secondary | ICD-10-CM | POA: Diagnosis not present

## 2018-08-17 DIAGNOSIS — Z114 Encounter for screening for human immunodeficiency virus [HIV]: Secondary | ICD-10-CM | POA: Diagnosis not present

## 2018-08-17 DIAGNOSIS — Z1329 Encounter for screening for other suspected endocrine disorder: Secondary | ICD-10-CM | POA: Diagnosis not present

## 2018-08-17 DIAGNOSIS — Z13220 Encounter for screening for lipoid disorders: Secondary | ICD-10-CM | POA: Diagnosis not present

## 2018-10-04 ENCOUNTER — Encounter (HOSPITAL_COMMUNITY): Payer: Self-pay

## 2018-10-04 ENCOUNTER — Other Ambulatory Visit: Payer: Self-pay

## 2018-10-04 ENCOUNTER — Emergency Department (HOSPITAL_COMMUNITY)
Admission: EM | Admit: 2018-10-04 | Discharge: 2018-10-04 | Disposition: A | Discharge status: Home / Self Care | Payer: BLUE CROSS/BLUE SHIELD | Attending: Emergency Medicine | Admitting: Emergency Medicine

## 2018-10-04 ENCOUNTER — Emergency Department (HOSPITAL_COMMUNITY): Payer: BLUE CROSS/BLUE SHIELD

## 2018-10-04 DIAGNOSIS — F17210 Nicotine dependence, cigarettes, uncomplicated: Secondary | ICD-10-CM | POA: Diagnosis not present

## 2018-10-04 DIAGNOSIS — R1031 Right lower quadrant pain: Secondary | ICD-10-CM | POA: Diagnosis not present

## 2018-10-04 DIAGNOSIS — Z79899 Other long term (current) drug therapy: Secondary | ICD-10-CM | POA: Diagnosis not present

## 2018-10-04 DIAGNOSIS — N200 Calculus of kidney: Secondary | ICD-10-CM | POA: Diagnosis not present

## 2018-10-04 DIAGNOSIS — N132 Hydronephrosis with renal and ureteral calculous obstruction: Secondary | ICD-10-CM | POA: Diagnosis not present

## 2018-10-04 DIAGNOSIS — R110 Nausea: Secondary | ICD-10-CM | POA: Diagnosis not present

## 2018-10-04 DIAGNOSIS — R109 Unspecified abdominal pain: Secondary | ICD-10-CM | POA: Diagnosis not present

## 2018-10-04 LAB — URINALYSIS, ROUTINE W REFLEX MICROSCOPIC
Bilirubin Urine: NEGATIVE
Glucose, UA: NEGATIVE mg/dL
Ketones, ur: 20 mg/dL — AB
NITRITE: NEGATIVE
PH: 8 (ref 5.0–8.0)
Protein, ur: NEGATIVE mg/dL
RBC / HPF: >50 RBC/hpf — ABNORMAL HIGH (ref 0–5)
SPECIFIC GRAVITY, URINE: 1.013 (ref 1.005–1.030)

## 2018-10-04 LAB — COMPREHENSIVE METABOLIC PANEL
ALBUMIN: 4.4 g/dL (ref 3.5–5.0)
ALK PHOS: 56 U/L (ref 38–126)
ALT: 13 U/L (ref 0–44)
AST: 18 U/L (ref 15–41)
Anion gap: 11 (ref 5–15)
BUN: 16 mg/dL (ref 6–20)
CALCIUM: 9.5 mg/dL (ref 8.9–10.3)
CHLORIDE: 104 mmol/L (ref 98–111)
CO2: 25 mmol/L (ref 22–32)
CREATININE: 0.88 mg/dL (ref 0.44–1.00)
GFR calc Af Amer: >60 mL/min (ref >60)
Glucose, Bld: 119 mg/dL — ABNORMAL HIGH (ref 70–99)
Potassium: 4.1 mmol/L (ref 3.5–5.1)
SODIUM: 140 mmol/L (ref 135–145)
Total Bilirubin: 0.5 mg/dL (ref 0.3–1.2)
Total Protein: 7.4 g/dL (ref 6.5–8.1)

## 2018-10-04 LAB — CBC
HEMATOCRIT: 44 % (ref 36.0–46.0)
Hemoglobin: 14.6 g/dL (ref 12.0–15.0)
MCH: 30 pg (ref 26.0–34.0)
MCHC: 33.2 g/dL (ref 30.0–36.0)
MCV: 90.5 fL (ref 80.0–100.0)
NRBC: 0 % (ref 0.0–0.2)
PLATELETS: 280 10*3/uL (ref 150–400)
RBC: 4.86 MIL/uL (ref 3.87–5.11)
RDW: 12.1 % (ref 11.5–15.5)
WBC: 15.7 10*3/uL — AB (ref 4.0–10.5)

## 2018-10-04 LAB — LIPASE, BLOOD: Lipase: 28 U/L (ref 11–51)

## 2018-10-04 LAB — I-STAT BETA HCG BLOOD, ED (MC, WL, AP ONLY): I-stat hCG, quantitative: <5 m[IU]/mL (ref <5)

## 2018-10-04 MED ORDER — ONDANSETRON 4 MG PO TBDP: ORAL_TABLET | ORAL | 0 refills | Status: DC

## 2018-10-04 MED ORDER — HYDROCODONE-ACETAMINOPHEN 5-325 MG PO TABS: 2.0000 | ORAL_TABLET | ORAL | 0 refills | Status: DC | PRN

## 2018-10-04 MED ORDER — TAMSULOSIN HCL 0.4 MG PO CAPS: 0.4000 mg | ORAL_CAPSULE | Freq: Every day | ORAL | 0 refills | Status: DC

## 2018-10-04 NOTE — ED Notes (Signed)
Bed: [facility] Expected date:  Expected time:  Means of arrival:  Comments: 

## 2018-10-04 NOTE — ED Provider Notes (Signed)
MSE was initiated and I personally evaluated the patient and placed orders (if any) at  6:23 PM on October 04, 2018.  The patient appears stable so that the remainder of the MSE may be completed by another provider.  PT with sudden onset right mid abd pain.  Mild nausea, no vag bleeding/discharge.  Mild TTP right mid abdomen.  preg neg, ordered CT.  Urine pending   Malvin Johns, MD 10/04/18 [phone]

## 2018-10-04 NOTE — ED Triage Notes (Signed)
Patient c/o RLQ pain approx 45 minutes ago. Patient denies any N/V/D.

## 2018-10-04 NOTE — ED Notes (Signed)
Talked with triage nurse about patient needing to lie down and pain.

## 2018-10-04 NOTE — ED Provider Notes (Signed)
[facility] DEPT Provider Note   CSN: [id number] Arrival date & time: 10/04/18  1254     History   Chief Complaint Chief Complaint  Patient presents with  . Abdominal Pain    HPI Ariel [name] is a 32 y.o. female.  Patient is a 32 year old female with a history of anxiety and hyperlipidemia who presents with right-sided abdominal pain.  She states that started earlier today and was sudden in onset.  She describes as a sharp pain just in her right mid abdomen.  Its nonradiating but she does have some discomfort across her low back.  She denies any urinary symptoms.  No vaginal bleeding or discharge.  She has some nausea but no vomiting.  No fevers.  No history of kidney stones.  She states the pain waxes and wanes in intensity.  Currently she is only having minimal pain.     Past Medical History:  Diagnosis Date  . Anxiety   . Hyperlipidemia   . Hypoglycemia     Patient Active Problem List   Diagnosis Date Noted  . Dyslipidemia 07/06/2015    Past Surgical History:  Procedure Laterality Date  . GANGLION CYST EXCISION Right 04/21/2017   Procedure: OPEN EXCISION DORSAL ULNAR GANGLION RIGHT WRIST;  Surgeon: Daryll Brod, MD;  Location: [facility];  Service: Orthopedics;  Laterality: Right;  . wisdom teeth extraction    . WRIST ARTHROSCOPY WITH DEBRIDEMENT Right 04/21/2017   Procedure: ARTHROSCOPY RIGHT WRIST with debridement;  Surgeon: Daryll Brod, MD;  Location: [facility];  Service: Orthopedics;  Laterality: Right;     OB History   None      Home Medications    Prior to Admission medications   Medication Sig Start Date End Date Taking? Authorizing Provider  ALPRAZolam Duanne Moron) 0.5 MG tablet Take 1 tablet by mouth as needed. 05/10/15   [provider]  FLAXSEED, LINSEED, PO Take by mouth.    [provider]  HYDROcodone-acetaminophen (NORCO/VICODIN) 5-325 MG tablet Take 2 tablets by  mouth every 4 (four) hours as needed. 10/04/18   Malvin Johns, MD  MILK THISTLE EXTRACT PO Take by mouth.    [provider]  Multiple Vitamins-Minerals (ZINC PO) Take by mouth as needed.    [provider]  omega-3 acid ethyl esters (LOVAZA) 1 g capsule Take 2 capsules (2 g total) by mouth 2 (two) times daily. Please schedule appointment for refills. 06/24/18   Pixie Casino, MD  ondansetron (ZOFRAN ODT) 4 MG disintegrating tablet 4mg  ODT q4 hours prn nausea/vomit 10/04/18   Malvin Johns, MD  Probiotic Product (PROBIOTIC DAILY PO) Take by mouth.    [provider]  tamsulosin (FLOMAX) 0.4 MG CAPS capsule Take 1 capsule (0.4 mg total) by mouth daily. 10/04/18   Malvin Johns, MD  TRI-PREVIFEM 0.18/0.215/0.25 MG-35 MCG tablet Take 1 tablet by mouth daily. 07/04/15   [provider]  valACYclovir (VALTREX) 1000 MG tablet Take 1,000 mg by mouth 2 (two) times daily as needed. 05/10/15   [provider]  Vitamin D, Cholecalciferol, 1000 units TABS Take by mouth.    [provider]  VITAMIN E PO Take by mouth.    [provider]    Family History Family History  Problem Relation Age of Onset  . Cancer Father   . Heart failure Father   . Stroke Maternal Grandmother   . Cancer Maternal Grandfather   . Heart attack Paternal Grandfather  Social History Social History   Tobacco Use  . Smoking status: Current Some Day Smoker    Packs/day: 0.20    Years: 10.00    Pack years: 2.00    Types: Cigarettes  . Smokeless tobacco: Never Used  Substance Use Topics  . Alcohol use: Yes    Comment:  drinks 7  a week  . Drug use: Yes    Types: Marijuana    Comment: occ     Allergies   Patient has no known allergies.   Review of Systems Review of Systems  Constitutional: Negative for chills, diaphoresis, fatigue and fever.  HENT: Negative for congestion, rhinorrhea and sneezing.   Eyes: Negative.   Respiratory: Negative for  cough, chest tightness and shortness of breath.   Cardiovascular: Negative for chest pain and leg swelling.  Gastrointestinal: Positive for abdominal pain and nausea. Negative for blood in stool, diarrhea and vomiting.  Genitourinary: Negative for difficulty urinating, flank pain, frequency and hematuria.  Musculoskeletal: Positive for back pain. Negative for arthralgias.  Skin: Negative for rash.  Neurological: Negative for dizziness, speech difficulty, weakness, numbness and headaches.     Physical Exam Updated Vital Signs BP (!) 122/91   Pulse 63   Temp (!) 97.4 F (36.3 C) (Oral)   Resp 16   Ht 5\' 2"  (1.575 m)   Wt 62.1 kg   LMP 09/20/2018   SpO2 100%   BMI 25.06 kg/m   Physical Exam  Constitutional: She is oriented to person, place, and time. She appears well-developed and well-nourished.  HENT:  Head: Normocephalic and atraumatic.  Eyes: Pupils are equal, round, and reactive to light.  Neck: Normal range of motion. Neck supple.  Cardiovascular: Normal rate, regular rhythm and normal heart sounds.  Pulmonary/Chest: Effort normal and breath sounds normal. No respiratory distress. She has no wheezes. She has no rales. She exhibits no tenderness.  Abdominal: Soft. Bowel sounds are normal. There is tenderness. There is no rebound and no guarding.  Mild tenderness in the right mid abdomen  Musculoskeletal: Normal range of motion. She exhibits no edema.  Lymphadenopathy:    She has no cervical adenopathy.  Neurological: She is alert and oriented to person, place, and time.  Skin: Skin is warm and dry. No rash noted.  Psychiatric: She has a normal mood and affect.     ED Treatments / Results  Labs (all labs ordered are listed, but only abnormal results are displayed) Labs Reviewed  COMPREHENSIVE METABOLIC PANEL - Abnormal; Notable for the following components:      Result Value   Glucose, Bld 119 (*)    All other components within normal limits  CBC - Abnormal;  Notable for the following components:   WBC 15.7 (*)    All other components within normal limits  URINALYSIS, ROUTINE W REFLEX MICROSCOPIC - Abnormal; Notable for the following components:   APPearance HAZY (*)    Hgb urine dipstick LARGE (*)    Ketones, ur 20 (*)    Leukocytes, UA TRACE (*)    RBC / HPF >50 (*)    Bacteria, UA RARE (*)    All other components within normal limits  LIPASE, BLOOD  I-STAT BETA HCG BLOOD, ED ([facility], [facility], [facility] ONLY)    EKG None  Radiology Ct Renal Stone Study  Result Date: 10/04/2018 CLINICAL DATA:  32 year old female with right lower quadrant/right flank pain starting at noon. Initial encounter. EXAM: CT ABDOMEN AND PELVIS WITHOUT CONTRAST TECHNIQUE: Multidetector CT imaging of  the abdomen and pelvis was performed following the standard protocol without IV contrast. COMPARISON:  None. FINDINGS: Lower chest: No worrisome lung base abnormality noted. Heart size within normal limits. Hepatobiliary: Taking into account limitation by non contrast imaging, no worrisome hepatic lesion. No calcified gallstone or common bile duct stone. Pancreas: Taking into account limitation by non contrast imaging, no worrisome pancreatic mass or inflammation. Spleen: Taking into account limitation by non contrast imaging, no splenic mass or enlargement. Adrenals/Urinary Tract: Proximal right ureteral 4 x 4 x 5 mm obstructing stone located 18 cm proximal to the right ureteral vesicle junction is causing moderate right-sided hydronephrosis. 5 mm right lower pole nonobstructing stone. Taking into account limitation by non contrast imaging, no worrisome renal, adrenal or urinary bladder lesion. Stomach/Bowel: No extraluminal bowel inflammatory process. Portions of the stomach, small bowel and colon are under distended slightly limiting evaluation. Vascular/Lymphatic: No abdominal aortic aneurysm. Scattered normal size lymph nodes. Reproductive: Right ovary larger than the left but within the  range of normal limits given patient's age. Uterus tilted to the right. Other: No free air or bowel containing hernia. Musculoskeletal: No worrisome osseous abnormality. IMPRESSION: 1. Proximal right ureteral 4 x 4 x 5 mm obstructing stone located 18 cm proximal to the right ureteral vesicle junction is causing moderate right-sided hydronephrosis. 5 mm right lower pole nonobstructing stone. 2. No extraluminal bowel inflammatory process noted. 3. Right ovary larger than the left but within the range of normal limits given patient's age. Electronically Signed   By: Genia Del M.D.   On: 10/04/2018 20:09    Procedures Procedures (including critical care time)  Medications Ordered in ED Medications - No data to display   Initial Impression / Assessment and Plan / ED Course  I have reviewed the triage vital signs and the nursing notes.  Pertinent labs & imaging results that were available during my care of the patient were reviewed by me and considered in my medical decision making (see chart for details).     Patient with a proximal 4 mm ureteral stone with moderate hydro-.  Her creatinine is normal.  Her urine is not consistent with infection.  She is afebrile.  Her pain is well controlled.  She was discharged home in good condition.  She was given prescriptions for hydrocodone, Zofran and Flomax.  She will take ibuprofen at home as well.  She was encouraged to call tomorrow to make a follow-up appointment with the urologist.  She was advised to return for any worsening pain, fever, vomiting, difficulty urinating or other worsening symptoms.  Final Clinical Impressions(s) / ED Diagnoses   Final diagnoses:  Kidney stone    ED Discharge Orders         Ordered    HYDROcodone-acetaminophen (NORCO/VICODIN) 5-325 MG tablet  Every 4 hours PRN     10/04/18 2130    ondansetron (ZOFRAN ODT) 4 MG disintegrating tablet     10/04/18 2130    tamsulosin (FLOMAX) 0.4 MG CAPS capsule  Daily      10/04/18 2130           Malvin Johns, MD 10/04/18 2137

## 2018-10-05 ENCOUNTER — Encounter (HOSPITAL_COMMUNITY): Payer: Self-pay | Admitting: General Practice

## 2018-10-05 ENCOUNTER — Other Ambulatory Visit: Payer: Self-pay | Admitting: Urology

## 2018-10-05 DIAGNOSIS — N202 Calculus of kidney with calculus of ureter: Secondary | ICD-10-CM | POA: Diagnosis not present

## 2018-10-05 MED FILL — TAMSULOSIN HCL 0.4 MG CAP: 0.4 | 10 days supply | Qty: 10 | Fill #0

## 2018-10-05 MED FILL — ONDANSETRON ODT 4 MG TABLET: 4 | 2 days supply | Qty: 10 | Fill #0

## 2018-10-05 MED FILL — HYDROCODON-APAP 5-325: 5-325 | 2 days supply | Qty: 15 | Fill #0

## 2018-10-11 ENCOUNTER — Other Ambulatory Visit: Payer: Self-pay

## 2018-10-11 ENCOUNTER — Ambulatory Visit (HOSPITAL_COMMUNITY)
Admission: RE | Admit: 2018-10-11 | Discharge: 2018-10-11 | Disposition: A | Discharge status: Home / Self Care | Payer: BLUE CROSS/BLUE SHIELD | Source: Ambulatory Visit | Attending: Urology | Admitting: Urology

## 2018-10-11 ENCOUNTER — Encounter (HOSPITAL_COMMUNITY): Payer: Self-pay | Admitting: *Deleted

## 2018-10-11 ENCOUNTER — Encounter (HOSPITAL_COMMUNITY)
Admission: RE | Disposition: A | Discharge status: Home / Self Care | Payer: Self-pay | Source: Ambulatory Visit | Attending: Urology

## 2018-10-11 ENCOUNTER — Ambulatory Visit (HOSPITAL_COMMUNITY): Payer: BLUE CROSS/BLUE SHIELD

## 2018-10-11 DIAGNOSIS — N201 Calculus of ureter: Secondary | ICD-10-CM | POA: Insufficient documentation

## 2018-10-11 DIAGNOSIS — F17210 Nicotine dependence, cigarettes, uncomplicated: Secondary | ICD-10-CM | POA: Diagnosis not present

## 2018-10-11 DIAGNOSIS — F419 Anxiety disorder, unspecified: Secondary | ICD-10-CM | POA: Diagnosis not present

## 2018-10-11 DIAGNOSIS — E7800 Pure hypercholesterolemia, unspecified: Secondary | ICD-10-CM | POA: Diagnosis not present

## 2018-10-11 DIAGNOSIS — Z79899 Other long term (current) drug therapy: Secondary | ICD-10-CM | POA: Insufficient documentation

## 2018-10-11 DIAGNOSIS — Z01818 Encounter for other preprocedural examination: Secondary | ICD-10-CM | POA: Diagnosis not present

## 2018-10-11 HISTORY — DX: Personal history of urinary calculi: Z87.442

## 2018-10-11 HISTORY — PX: EXTRACORPOREAL SHOCK WAVE LITHOTRIPSY: SHX1557

## 2018-10-11 LAB — PREGNANCY, URINE: Preg Test, Ur: NEGATIVE

## 2018-10-11 SURGERY — LITHOTRIPSY, ESWL
Anesthesia: LOCAL | Laterality: Right

## 2018-10-11 MED ORDER — DIPHENHYDRAMINE HCL 25 MG PO CAPS
25.0000 mg | ORAL_CAPSULE | ORAL | Status: AC
  Administered 2018-10-11: 25 mg via ORAL
  Filled 2018-10-11: qty 1

## 2018-10-11 MED ORDER — DIAZEPAM 5 MG PO TABS
10.0000 mg | ORAL_TABLET | ORAL | Status: AC
  Administered 2018-10-11: 10 mg via ORAL
  Filled 2018-10-11: qty 2

## 2018-10-11 MED ORDER — OXYCODONE HCL 5 MG PO TABS: 5.0000 mg | ORAL_TABLET | ORAL | Status: DC | PRN

## 2018-10-11 MED ORDER — SODIUM CHLORIDE 0.9 % IV SOLN
INTRAVENOUS | Status: DC
  Administered 2018-10-11: 14:00:00 via INTRAVENOUS

## 2018-10-11 MED ORDER — CIPROFLOXACIN HCL 500 MG PO TABS
500.0000 mg | ORAL_TABLET | ORAL | Status: AC
  Administered 2018-10-11: 500 mg via ORAL
  Filled 2018-10-11: qty 1

## 2018-10-11 NOTE — Op Note (Signed)
See [facility] OP note scanned into chart. Also because of the size, density, location and other factors that cannot be anticipated I feel this will likely be a staged procedure. This fact supersedes any indication in the scanned [facility] operative note to the contrary.  

## 2018-10-11 NOTE — H&P (Signed)
Acute Kidney Stone  HPI: Ariel [name] is a 32 year-old female patient who is here for further eval and management of kidney stones.  She was diagnosed with a kidney stone on 10/04/2018. The patient presented to [facility] with symptoms of a kidney stone.   Her pain started about 10/04/2018. The pain is on the right side.   Abdomen/Pelvic CT: 11/25 - 39mm stone in proximal right ureter. The patient underwent CT scan prior to today's appointment.   The patient relates initially having nausea. She is currently having flank pain, back pain, nausea, and chills. She denies having groin pain, vomiting, fever, and voiding symptoms. She has been taking tamsulosin, zofran, oxycodone. She has not caught a stone in her urine strainer since her symptoms began.   She has never had surgical treatment for calculi in the past. This is her first kidney stone.   The CT scan also demonstrated a 6mm non obstructing stone in right lower pole.   Currently, the patient's symptoms are significantly better than they were last night. She denies any fevers or chills. This is the 1st stone. Her mother has kidney stones.     ALLERGIES: None   MEDICATIONS: Tamsulosin Hcl 0.4 mg capsule  Alprazolam 0.5 mg tablet  Flaxseed  Ginkgo  Hydrocodone-Acetaminophen 5 mg-325 mg tablet  Lovaza 1 gram capsule  Ondansetron Hcl 4 mg tablet  Probiotic  Tri-Previfem 0.18 mg-35 mcg (7)/0.215 mg-35 mcg (7)/0.25 mg-35 mcg(7)(28) tablet  Valacyclovir 1,000 mg tablet  Vitamin D  Vitamin E     GU PSH: None     PSH Notes: Wisdom teeth (2004), lasik (2018), ganglion cyst removed (2018)   NON-GU PSH: None   GU PMH: None   NON-GU PMH: Anxiety Hypercholesterolemia    FAMILY HISTORY: Coronary Artery Disease - Grandmother heart failure - Father liver cancer - Father nephrolithiasis - Mother   SOCIAL HISTORY: Marital Status: Single Preferred Language: English; Ethnicity: Not Hispanic Or Latino; Race: White Current  Smoking Status: Patient smokes. Has smoked since 09/11/2003. Smokes less than 1/2 pack per day.   Tobacco Use Assessment Completed: Used Tobacco in last 30 days? Does drink.  Drinks 3 caffeinated drinks per day. 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    REVIEW OF SYSTEMS:    GU Review Female:   Patient denies frequent urination, hard to postpone urination, burning /pain with urination, get up at night to urinate, leakage of urine, stream starts and stops, trouble starting your stream, have to strain to urinate, and being pregnant.  Gastrointestinal (Upper):   Patient reports nausea. Patient denies vomiting and indigestion/ heartburn.  Gastrointestinal (Lower):   Patient denies diarrhea and constipation.  Constitutional:   Patient denies fever, night sweats, weight loss, and fatigue.  Skin:   Patient denies skin rash/ lesion and itching.  Eyes:   Patient denies blurred vision and double vision.  Ears/ Nose/ Throat:   Patient denies sore throat and sinus problems.  Hematologic/Lymphatic:   Patient denies swollen glands and easy bruising.  Cardiovascular:   Patient denies leg swelling and chest pains.  Respiratory:   Patient denies cough and shortness of breath.  Endocrine:   Patient denies excessive thirst.  Musculoskeletal:   Patient reports back pain. Patient denies joint pain.  Neurological:   Patient denies headaches and dizziness.  Psychologic:   Patient reports anxiety. Patient denies depression.   VITAL SIGNS:      10/05/2018 10:43 AM  Weight 137 lb / 62.14 kg  Height 62 in / 157.48 cm  BP 131/82 mmHg  Pulse 81 /min  Temperature 98.2 F / 36.7 C  BMI 25.1 kg/m   MULTI-SYSTEM PHYSICAL EXAMINATION:    Constitutional: Well-nourished. No physical deformities. Normally developed. Good grooming.  Neck: Neck symmetrical, not swollen. Normal tracheal position.  Respiratory: Labored breathing. No use of accessory muscles.   Cardiovascular: Regular rate and rhythm. No murmur, no  gallop. Normal temperature, normal extremity pulses, no swelling, no varicosities.   Lymphatic: No enlargement of neck, axillae, groin.  Skin: No paleness, no jaundice, no cyanosis. No lesion, no ulcer, no rash.  Neurologic / Psychiatric: Oriented to time, oriented to place, oriented to person. No depression, no anxiety, no agitation.  Gastrointestinal: No mass, no tenderness, no rigidity, non obese abdomen.  Eyes: Normal conjunctivae. Normal eyelids.  Ears, Nose, Mouth, and Throat: Left ear no scars, no lesions, no masses. Right ear no scars, no lesions, no masses. Nose no scars, no lesions, no masses. Normal hearing. Normal lips.  Musculoskeletal: Normal gait and station of head and neck.     PAST DATA REVIEWED:  Source Of History:  Patient  Records Review:   Previous Doctor Records, Previous Patient Records, POC Tool  X-Ray Review: C.T. Abdomen/Pelvis: Reviewed Films. The patient's scout film shows easily visualized stones with plain x-ray.    PROCEDURES:          Urinalysis w/Scope - 81001 Dipstick Dipstick Cont'd Micro  Color: Yellow Bilirubin: Neg mg/dL WBC/hpf: 0 - 5/hpf  Appearance: Clear Ketones: Neg mg/dL RBC/hpf: 3 - 10/hpf  Specific Gravity: 1.020 Blood: 3+ ery/uL Bacteria: NS (Not Seen)  pH: <=5.0 Protein: Neg mg/dL Cystals: Ca Oxalate  Glucose: Neg mg/dL Urobilinogen: 0.2 mg/dL Casts: NS (Not Seen)    Nitrites: Neg Trichomonas: Not Present    Leukocyte Esterase: Neg leu/uL Mucous: Not Present      Epithelial Cells: 0 - 5/hpf      Yeast: NS (Not Seen)      Sperm: Not Present    ASSESSMENT:      ICD-10 Details  1 GU:   Renal and ureteral calculus - N20.2    PLAN:           Document Letter(s):  Created for Patient: Clinical Summary    We discussed management options including medical expulsion therapy, shockwave lithotripsy, and ureteroscopy. Ultimately, the patient has opted for shock wave lithotripsy. I discussed with the patient the procedure in detail as well  as the risk and benefits. The patient is aware that she may need additional procedures. She also is aware of the risks of hematoma and pain. We will try to get this patient's scheduled as soon as possible.         Notes:   The patient had discussed her treatment options. We discussed medical expulsion therapy versus shockwave lithotripsy versus ureteroscopy. She understands that with shockwave lithotripsy she has around a 70% chance success. This is based on the fact that she is fairly thin, the stone is easily visualized on KUB, and easily targeted, and it is fairly small. However, she does understand the risk of requiring additional procedures as well as ongoing pain following the procedure. She also understands that if we chose ureteroscopy the patient would be able to treat both stones at once. After a detailed discussion she has chosen shockwave lithotripsy. Will try to get her scheduled as soon as possible.

## 2018-10-11 NOTE — Discharge Instructions (Signed)
See [facility] discharge instructions in chart.  

## 2018-10-12 ENCOUNTER — Encounter (HOSPITAL_COMMUNITY): Payer: Self-pay | Admitting: Urology

## 2018-10-25 DIAGNOSIS — N200 Calculus of kidney: Secondary | ICD-10-CM | POA: Diagnosis not present

## 2018-10-26 DIAGNOSIS — N200 Calculus of kidney: Secondary | ICD-10-CM | POA: Diagnosis not present

## 2018-11-22 ENCOUNTER — Other Ambulatory Visit: Payer: Self-pay | Admitting: Internal Medicine

## 2018-11-22 DIAGNOSIS — E785 Hyperlipidemia, unspecified: Secondary | ICD-10-CM

## 2018-11-23 ENCOUNTER — Other Ambulatory Visit: Payer: Self-pay | Admitting: Internal Medicine

## 2018-11-23 DIAGNOSIS — E785 Hyperlipidemia, unspecified: Secondary | ICD-10-CM

## 2018-11-23 DIAGNOSIS — N200 Calculus of kidney: Secondary | ICD-10-CM | POA: Diagnosis not present

## 2019-01-17 ENCOUNTER — Telehealth: Payer: Self-pay | Admitting: Internal Medicine

## 2019-01-17 ENCOUNTER — Other Ambulatory Visit: Payer: Self-pay

## 2019-01-17 DIAGNOSIS — E785 Hyperlipidemia, unspecified: Secondary | ICD-10-CM

## 2019-01-17 MED ORDER — OMEGA-3-ACID ETHYL ESTERS 1 G PO CAPS: 2.0000 g | ORAL_CAPSULE | Freq: Two times a day (BID) | ORAL | 0 refills | Status: DC

## 2019-01-17 NOTE — Telephone Encounter (Signed)
° ° °*  STAT* If patient is at the pharmacy, call can be transferred to refill team.   1. Which medications need to be refilled? (please list name of each medication and dose if known) omega-3 acid ethyl esters (LOVAZA) 1 g capsule  2. Which pharmacy/location (including street and city if local pharmacy) is medication to be sent to? [id number] - [place],  - [address]  3. Do they need a 30 day or 90 day supply? Guadalupe Guerra

## 2019-02-08 ENCOUNTER — Telehealth: Payer: Self-pay | Admitting: Internal Medicine

## 2019-02-08 ENCOUNTER — Other Ambulatory Visit: Payer: Self-pay | Admitting: Internal Medicine

## 2019-02-08 DIAGNOSIS — E785 Hyperlipidemia, unspecified: Secondary | ICD-10-CM

## 2019-02-08 MED ORDER — OMEGA-3-ACID ETHYL ESTERS 1 G PO CAPS: 2.0000 g | ORAL_CAPSULE | Freq: Two times a day (BID) | ORAL | 1 refills | Status: DC

## 2019-02-08 NOTE — Telephone Encounter (Signed)
°*  STAT* If patient is at the pharmacy, call can be transferred to refill team.   1. Which medications need to be refilled? (please list name of each medication and dose if known) omega-3 acid ethyl esters (LOVAZA) 1 g capsule  2. Which pharmacy/location (including street and city if local pharmacy) is medication to be sent to? [id number]   3. Do they need a 30 day or 90 day supply? 30  Pt has appt scheduled for April 28th, but will run out of meds before her appt

## 2019-02-08 NOTE — Telephone Encounter (Signed)
New Message    *STAT* If patient is at the pharmacy, call can be transferred to refill team.   1. Which medications need to be refilled? (please list name of each medication and dose if known) omega-3 acid ethyl esters (LOVAZA) 1 g capsule    2. Which pharmacy/location (including street and city if local pharmacy) is medication to be sent to? [id number] - [place], [place] - [address]  3. Do they need a 30 day or 90 day supply? [place]

## 2019-02-20 ENCOUNTER — Other Ambulatory Visit: Payer: Self-pay | Admitting: Internal Medicine

## 2019-02-20 DIAGNOSIS — E785 Hyperlipidemia, unspecified: Secondary | ICD-10-CM

## 2019-03-03 ENCOUNTER — Telehealth: Payer: Self-pay

## 2019-03-03 ENCOUNTER — Other Ambulatory Visit: Payer: Self-pay | Admitting: Internal Medicine

## 2019-03-03 DIAGNOSIS — E785 Hyperlipidemia, unspecified: Secondary | ICD-10-CM

## 2019-03-03 NOTE — Telephone Encounter (Signed)

## 2019-03-03 NOTE — Telephone Encounter (Signed)
lovaza 1 gm refilled.

## 2019-03-08 ENCOUNTER — Ambulatory Visit: Payer: BLUE CROSS/BLUE SHIELD | Admitting: Internal Medicine

## 2019-03-16 DIAGNOSIS — Z319 Encounter for procreative management, unspecified: Secondary | ICD-10-CM | POA: Diagnosis not present

## 2019-03-16 DIAGNOSIS — Z3181 Encounter for male factor infertility in female patient: Secondary | ICD-10-CM | POA: Diagnosis not present

## 2019-03-22 DIAGNOSIS — Z319 Encounter for procreative management, unspecified: Secondary | ICD-10-CM | POA: Diagnosis not present

## 2019-03-22 DIAGNOSIS — Z31430 Encounter of female for testing for genetic disease carrier status for procreative management: Secondary | ICD-10-CM | POA: Diagnosis not present

## 2019-03-22 DIAGNOSIS — Z3181 Encounter for male factor infertility in female patient: Secondary | ICD-10-CM | POA: Diagnosis not present

## 2019-03-29 ENCOUNTER — Telehealth: Payer: Self-pay | Admitting: Internal Medicine

## 2019-03-29 ENCOUNTER — Other Ambulatory Visit: Payer: Self-pay | Admitting: Internal Medicine

## 2019-03-29 DIAGNOSIS — E785 Hyperlipidemia, unspecified: Secondary | ICD-10-CM

## 2019-03-29 DIAGNOSIS — N200 Calculus of kidney: Secondary | ICD-10-CM | POA: Diagnosis not present

## 2019-03-30 ENCOUNTER — Telehealth (INDEPENDENT_AMBULATORY_CARE_PROVIDER_SITE_OTHER): Payer: BLUE CROSS/BLUE SHIELD | Admitting: Internal Medicine

## 2019-03-30 DIAGNOSIS — E785 Hyperlipidemia, unspecified: Secondary | ICD-10-CM

## 2019-03-30 MED ORDER — OMEGA-3-ACID ETHYL ESTERS 1 G PO CAPS: 2.0000 g | ORAL_CAPSULE | Freq: Two times a day (BID) | ORAL | 1 refills | Status: DC

## 2019-03-30 NOTE — Patient Instructions (Signed)
Medication Instructions:  Dr Debara Pickett recommends that you continue on your current medications as directed. Please refer to the Current Medication list given to you today.  If you need a refill on your cardiac medications before your next appointment, please call your pharmacy.   Lab work: Your physician recommends that you return for lab work at your earliest [place].  If you have labs (blood work) drawn today and your tests are completely normal, you will receive your results only by: Marland Kitchen MyChart Message (if you have MyChart) OR . A paper copy in the mail If you have any lab test that is abnormal or we need to change your treatment, we will call you to review the results.  Follow-Up: At [facility], you and your health needs are our priority.  As part of our continuing mission to provide you with exceptional heart care, we have created designated Provider Care Teams.  These Care Teams include your primary Cardiologist (physician) and Advanced Practice Providers (APPs -  Physician Assistants and Nurse Practitioners) who all work together to provide you with the care you need, when you need it. You will need a follow up appointment in 12 months.  Please call our office 2 months in advance to schedule this appointment.  You may see Pixie Casino, MD or one of the following Advanced Practice Providers on your designated Care Team: [place], [place] . Fabian Sharp, PA-C . You will receive a reminder letter in the mail two months in advance. If you don't receive a letter, please call our office to schedule the follow-up appointment.

## 2019-03-31 ENCOUNTER — Encounter: Payer: Self-pay | Admitting: Internal Medicine

## 2019-03-31 NOTE — Progress Notes (Signed)
Virtual Visit via Video Note   This visit type was conducted due to national recommendations for restrictions regarding the COVID-19 Pandemic (e.g. social distancing) in an effort to limit this patient's exposure and mitigate transmission in our community.  Due to her co-morbid illnesses, this patient is at least at moderate risk for complications without adequate follow up.  This format is felt to be most appropriate for this patient at this time.  All issues noted in this document were discussed and addressed.  A limited physical exam was performed with this format.  Please refer to the patient's chart for her consent to telehealth for [facility].   Evaluation Performed:  Doximity video visit  Date:  03/31/2019   ID:  Ariel [name], DOB 1986/08/13, MRN [id number]  Patient Location:  [address] [place] [address]  Provider location:   [address] [place], [place] [address]  PCP:  Servando Salina, MD  Cardiologist:  Pixie Casino, MD Electrophysiologist:  None   Chief Complaint:  No complaints  History of Present Illness:    Ariel [name] is a 33 y.o. female who presents via audio/video conferencing for a telehealth visit today.  Ariel [name] was seen today for video follow-up of dyslipidemia.  She has been Lovaza to lower her triglycerides.  She is due for repeat lipid profile.  All she seems to be tolerating it well.  As she has no known coronary disease prior stroke or diabetes with risk factors, I doubt she would be a candidate for Vascepa although I do feel that this is likely more of a beneficial medication.  She also discussed that she is interesting in having a child.  She wondered whether it was safe to continue on the omega-3's during pregnancy.  Review of the package insert indicates that it is category C for pregnancy and given the fact that we are trying to prevent events long-term, it would be more safe probably to discontinue it for  the 9 months of pregnancy.  Also it is excreted in breast milk and therefore I would not recommend using it during lactation if she were breast-feeding.  The patient does not have symptoms concerning for COVID-19 infection (fever, chills, cough, or new SHORTNESS OF BREATH).    Prior CV studies:   The following studies were reviewed today:  Chart review  PMHx:  Past Medical History:  Diagnosis Date   Anxiety    History of kidney stones    Hyperlipidemia    Hypoglycemia     Past Surgical History:  Procedure Laterality Date   EXTRACORPOREAL SHOCK WAVE LITHOTRIPSY Right 10/11/2018   Procedure: EXTRACORPOREAL SHOCK WAVE LITHOTRIPSY (ESWL);  Surgeon: Ardis Hughs, MD;  Location: [facility];  Service: Urology;  Laterality: Right;   GANGLION CYST EXCISION Right 04/21/2017   Procedure: OPEN EXCISION DORSAL ULNAR GANGLION RIGHT WRIST;  Surgeon: Daryll Brod, MD;  Location: [facility];  Service: Orthopedics;  Laterality: Right;   REFRACTIVE SURGERY  2018   wisdom teeth extraction     WRIST ARTHROSCOPY WITH DEBRIDEMENT Right 04/21/2017   Procedure: ARTHROSCOPY RIGHT WRIST with debridement;  Surgeon: Daryll Brod, MD;  Location: [facility];  Service: Orthopedics;  Laterality: Right;    FAMHx:  Family History  Problem Relation Age of Onset   Cancer Father    Heart failure Father    Stroke Maternal Grandmother    Cancer Maternal Grandfather    Heart attack Paternal Grandfather  SOCHx:   reports that she has been smoking cigarettes. She has a 2.00 pack-year smoking history. She has never used smokeless tobacco. She reports current alcohol use. She reports current drug use. Drug: Marijuana.  ALLERGIES:  No Known Allergies  MEDS:  Current Meds  Medication Sig   ALPRAZolam (XANAX) 0.5 MG tablet Take 1 tablet by mouth as needed.   diphenhydrAMINE (BENADRYL) 25 MG tablet Take 50 mg by mouth every 6 (six) hours as needed.   FLAXSEED,  LINSEED, PO Take by mouth.   MILK THISTLE EXTRACT PO Take by mouth.   Multiple Vitamins-Minerals (ZINC PO) Take by mouth as needed.   omega-3 acid ethyl esters (LOVAZA) 1 g capsule Take 2 capsules (2 g total) by mouth 2 (two) times daily.   ondansetron (ZOFRAN ODT) 4 MG disintegrating tablet 4mg  ODT q4 hours prn nausea/vomit   Probiotic Product (PROBIOTIC DAILY PO) Take by mouth.   valACYclovir (VALTREX) 1000 MG tablet Take 1,000 mg by mouth 2 (two) times daily as needed.   Vitamin D, Cholecalciferol, 1000 units TABS Take by mouth.   VITAMIN E PO Take by mouth.   [DISCONTINUED] omega-3 acid ethyl esters (LOVAZA) 1 g capsule Take 2 capsules (2 g total) by mouth 2 (two) times daily.     ROS: Pertinent items noted in HPI and remainder of comprehensive ROS otherwise negative.  Labs/Other Tests and Data Reviewed:    Recent Labs: 10/04/2018: ALT 13; BUN 16; Creatinine, Ser 0.88; Hemoglobin 14.6; Platelets 280; Potassium 4.1; Sodium 140   Recent Lipid Panel No results found for: CHOL, TRIG, HDL, CHOLHDL, LDLCALC, LDLDIRECT  Wt Readings from Last 3 Encounters:  03/30/19 137 lb (62.1 kg)  10/11/18 139 lb 4 oz (63.2 kg)  10/04/18 137 lb (62.1 kg)     Exam:    Vital Signs:  BP 114/75    Pulse 79    Ht 5\' 2"  (1.575 m)    Wt 137 lb (62.1 kg)    SpO2 97%    BMI 25.06 kg/m    General appearance: alert and no distress Lungs: No visual respiratory difficulty Extremities: extremities normal, atraumatic, no cyanosis or edema Skin: Skin color, texture, turgor normal. No rashes or lesions Neurologic: Mental status: Alert, oriented, thought content appropriate Psych: Pleasant  ASSESSMENT & PLAN:    1. Elevated triglycerides  Ariel [name] is due for reassessment of her triglycerides.  She is done well with Lovaza and will have to hold it however if she desires pregnancy.  I have encouraged her to stop smoking now especially if she is looking to get pregnant.  We will contact her with  the results of her lab work and plan follow-up annually or sooner as necessary.  COVID-19 Education: The signs and symptoms of COVID-19 were discussed with the patient and how to seek care for testing (follow up with PCP or arrange E-visit).  The importance of social distancing was discussed today.  Patient Risk:   After full review of this patients clinical status, I feel that they are at least moderate risk at this time.  Time:   Today, I have spent 15 minutes with the patient with telehealth technology discussing dyslipidemia.     Medication Adjustments/Labs and Tests Ordered: Current medicines are reviewed at length with the patient today.  Concerns regarding medicines are outlined above.   Tests Ordered: Orders Placed This Encounter  Procedures   Lipid panel    Medication Changes: Meds ordered this encounter  Medications   omega-3  acid ethyl esters (LOVAZA) 1 g capsule    Sig: Take 2 capsules (2 g total) by mouth 2 (two) times daily.    Dispense:  120 capsule    Refill:  1    Disposition:  in 1 year(s)  Pixie Casino, MD, [facility], [place] Director of the Advanced Lipid Disorders &  Cardiovascular Risk Reduction Clinic Diplomate of the American Board of Clinical Lipidology Attending Cardiologist  Direct Dial: [phone]   Fax: [phone]  Website:  [email or web address]  Pixie Casino, MD  03/31/2019 9:22 AM

## 2019-04-18 DIAGNOSIS — Z113 Encounter for screening for infections with a predominantly sexual mode of transmission: Secondary | ICD-10-CM | POA: Diagnosis not present

## 2019-04-18 DIAGNOSIS — E288 Other ovarian dysfunction: Secondary | ICD-10-CM | POA: Diagnosis not present

## 2019-04-18 DIAGNOSIS — Z319 Encounter for procreative management, unspecified: Secondary | ICD-10-CM | POA: Diagnosis not present

## 2019-04-21 ENCOUNTER — Other Ambulatory Visit: Payer: Self-pay | Admitting: Internal Medicine

## 2019-04-21 DIAGNOSIS — E785 Hyperlipidemia, unspecified: Secondary | ICD-10-CM

## 2019-04-21 MED ORDER — OMEGA-3-ACID ETHYL ESTERS 1 G PO CAPS: 2.0000 g | ORAL_CAPSULE | Freq: Two times a day (BID) | ORAL | 2 refills | Status: DC

## 2019-05-02 DIAGNOSIS — Z113 Encounter for screening for infections with a predominantly sexual mode of transmission: Secondary | ICD-10-CM | POA: Diagnosis not present

## 2019-05-16 DIAGNOSIS — N856 Intrauterine synechiae: Secondary | ICD-10-CM | POA: Diagnosis not present

## 2019-05-16 DIAGNOSIS — Z3141 Encounter for fertility testing: Secondary | ICD-10-CM | POA: Diagnosis not present

## 2019-08-01 DIAGNOSIS — Z113 Encounter for screening for infections with a predominantly sexual mode of transmission: Secondary | ICD-10-CM | POA: Diagnosis not present

## 2019-08-08 DIAGNOSIS — N8500 Endometrial hyperplasia, unspecified: Secondary | ICD-10-CM | POA: Diagnosis not present

## 2019-09-12 DIAGNOSIS — Z3183 Encounter for assisted reproductive fertility procedure cycle: Secondary | ICD-10-CM | POA: Diagnosis not present

## 2019-09-12 DIAGNOSIS — Z3181 Encounter for male factor infertility in female patient: Secondary | ICD-10-CM | POA: Diagnosis not present

## 2019-09-12 DIAGNOSIS — Z113 Encounter for screening for infections with a predominantly sexual mode of transmission: Secondary | ICD-10-CM | POA: Diagnosis not present

## 2019-09-15 DIAGNOSIS — Z3141 Encounter for fertility testing: Secondary | ICD-10-CM | POA: Diagnosis not present

## 2019-09-16 DIAGNOSIS — Z3181 Encounter for male factor infertility in female patient: Secondary | ICD-10-CM | POA: Diagnosis not present

## 2019-09-16 DIAGNOSIS — Z113 Encounter for screening for infections with a predominantly sexual mode of transmission: Secondary | ICD-10-CM | POA: Diagnosis not present

## 2019-09-16 DIAGNOSIS — Z3183 Encounter for assisted reproductive fertility procedure cycle: Secondary | ICD-10-CM | POA: Diagnosis not present

## 2019-09-30 ENCOUNTER — Other Ambulatory Visit: Payer: Self-pay

## 2019-09-30 DIAGNOSIS — Z20822 Contact with and (suspected) exposure to covid-19: Secondary | ICD-10-CM

## 2019-10-03 LAB — NOVEL CORONAVIRUS, NAA: SARS-CoV-2, NAA: NOT DETECTED

## 2019-10-07 DIAGNOSIS — Z113 Encounter for screening for infections with a predominantly sexual mode of transmission: Secondary | ICD-10-CM | POA: Diagnosis not present

## 2019-11-07 DIAGNOSIS — Z113 Encounter for screening for infections with a predominantly sexual mode of transmission: Secondary | ICD-10-CM | POA: Diagnosis not present

## 2019-11-23 DIAGNOSIS — Z3200 Encounter for pregnancy test, result unknown: Secondary | ICD-10-CM | POA: Diagnosis not present

## 2019-11-23 DIAGNOSIS — Z3201 Encounter for pregnancy test, result positive: Secondary | ICD-10-CM | POA: Diagnosis not present

## 2019-11-25 DIAGNOSIS — Z3200 Encounter for pregnancy test, result unknown: Secondary | ICD-10-CM | POA: Diagnosis not present

## 2019-11-25 DIAGNOSIS — Z3201 Encounter for pregnancy test, result positive: Secondary | ICD-10-CM | POA: Diagnosis not present

## 2019-11-28 DIAGNOSIS — Z3200 Encounter for pregnancy test, result unknown: Secondary | ICD-10-CM | POA: Diagnosis not present

## 2019-11-28 DIAGNOSIS — Z3201 Encounter for pregnancy test, result positive: Secondary | ICD-10-CM | POA: Diagnosis not present

## 2019-12-09 DIAGNOSIS — Z13220 Encounter for screening for lipoid disorders: Secondary | ICD-10-CM | POA: Diagnosis not present

## 2019-12-09 DIAGNOSIS — R5383 Other fatigue: Secondary | ICD-10-CM | POA: Diagnosis not present

## 2019-12-09 DIAGNOSIS — Z131 Encounter for screening for diabetes mellitus: Secondary | ICD-10-CM | POA: Diagnosis not present

## 2019-12-19 ENCOUNTER — Ambulatory Visit: Payer: BC Managed Care – PPO | Attending: Internal Medicine

## 2019-12-19 DIAGNOSIS — Z20822 Contact with and (suspected) exposure to covid-19: Secondary | ICD-10-CM

## 2019-12-20 LAB — NOVEL CORONAVIRUS, NAA: SARS-CoV-2, NAA: NOT DETECTED

## 2020-01-03 DIAGNOSIS — Z3181 Encounter for male factor infertility in female patient: Secondary | ICD-10-CM | POA: Diagnosis not present

## 2020-01-03 DIAGNOSIS — Z3183 Encounter for assisted reproductive fertility procedure cycle: Secondary | ICD-10-CM | POA: Diagnosis not present

## 2020-01-03 DIAGNOSIS — Z113 Encounter for screening for infections with a predominantly sexual mode of transmission: Secondary | ICD-10-CM | POA: Diagnosis not present

## 2020-01-03 DIAGNOSIS — Z3201 Encounter for pregnancy test, result positive: Secondary | ICD-10-CM | POA: Diagnosis not present

## 2020-01-17 DIAGNOSIS — Z3200 Encounter for pregnancy test, result unknown: Secondary | ICD-10-CM | POA: Diagnosis not present

## 2020-01-17 DIAGNOSIS — Z3201 Encounter for pregnancy test, result positive: Secondary | ICD-10-CM | POA: Diagnosis not present

## 2020-01-19 DIAGNOSIS — Z3201 Encounter for pregnancy test, result positive: Secondary | ICD-10-CM | POA: Diagnosis not present

## 2020-01-19 DIAGNOSIS — Z3200 Encounter for pregnancy test, result unknown: Secondary | ICD-10-CM | POA: Diagnosis not present

## 2020-01-23 DIAGNOSIS — Z3200 Encounter for pregnancy test, result unknown: Secondary | ICD-10-CM | POA: Diagnosis not present

## 2020-01-23 DIAGNOSIS — Z3201 Encounter for pregnancy test, result positive: Secondary | ICD-10-CM | POA: Diagnosis not present

## 2020-02-01 DIAGNOSIS — Z3201 Encounter for pregnancy test, result positive: Secondary | ICD-10-CM | POA: Diagnosis not present

## 2020-02-02 DIAGNOSIS — Z331 Pregnant state, incidental: Secondary | ICD-10-CM | POA: Diagnosis not present

## 2020-02-02 DIAGNOSIS — E02 Subclinical iodine-deficiency hypothyroidism: Secondary | ICD-10-CM | POA: Diagnosis not present

## 2020-02-02 DIAGNOSIS — E162 Hypoglycemia, unspecified: Secondary | ICD-10-CM | POA: Diagnosis not present

## 2020-02-02 DIAGNOSIS — E785 Hyperlipidemia, unspecified: Secondary | ICD-10-CM | POA: Diagnosis not present

## 2020-02-15 DIAGNOSIS — O0900 Supervision of pregnancy with history of infertility, unspecified trimester: Secondary | ICD-10-CM | POA: Diagnosis not present

## 2020-02-24 DIAGNOSIS — E02 Subclinical iodine-deficiency hypothyroidism: Secondary | ICD-10-CM | POA: Diagnosis not present

## 2020-03-10 HISTORY — PX: DILATION AND EVACUATION: SHX1459

## 2020-03-13 DIAGNOSIS — Z3401 Encounter for supervision of normal first pregnancy, first trimester: Secondary | ICD-10-CM | POA: Diagnosis not present

## 2020-03-13 DIAGNOSIS — E039 Hypothyroidism, unspecified: Secondary | ICD-10-CM | POA: Diagnosis not present

## 2020-03-13 DIAGNOSIS — O021 Missed abortion: Secondary | ICD-10-CM | POA: Diagnosis not present

## 2020-03-15 DIAGNOSIS — E02 Subclinical iodine-deficiency hypothyroidism: Secondary | ICD-10-CM | POA: Diagnosis not present

## 2020-03-21 DIAGNOSIS — O021 Missed abortion: Secondary | ICD-10-CM | POA: Diagnosis not present

## 2020-04-04 DIAGNOSIS — O021 Missed abortion: Secondary | ICD-10-CM | POA: Diagnosis not present

## 2020-04-19 DIAGNOSIS — E02 Subclinical iodine-deficiency hypothyroidism: Secondary | ICD-10-CM | POA: Diagnosis not present

## 2020-04-24 DIAGNOSIS — E02 Subclinical iodine-deficiency hypothyroidism: Secondary | ICD-10-CM | POA: Diagnosis not present

## 2020-04-30 DIAGNOSIS — N96 Recurrent pregnancy loss: Secondary | ICD-10-CM | POA: Diagnosis not present

## 2020-04-30 DIAGNOSIS — Z3181 Encounter for male factor infertility in female patient: Secondary | ICD-10-CM | POA: Diagnosis not present

## 2020-05-21 DIAGNOSIS — Z3141 Encounter for fertility testing: Secondary | ICD-10-CM | POA: Diagnosis not present

## 2020-05-21 DIAGNOSIS — N8500 Endometrial hyperplasia, unspecified: Secondary | ICD-10-CM | POA: Diagnosis not present

## 2020-05-31 IMAGING — CT CT RENAL STONE PROTOCOL
2 of 4 series · 16 of 46 positions shown, 18 images · non-contrast
Comparison: None.

CLINICAL DATA: 31-year-old female with right lower quadrant/right
flank pain starting at noon. Initial encounter.

EXAM:
CT ABDOMEN AND PELVIS WITHOUT CONTRAST
TECHNIQUE: Multidetector CT imaging of the abdomen and pelvis was performed
following the standard protocol without IV contrast.

[Series 2: axial st · axial · 0.63mm/px · z∈[+1317,+1697]mm · 13 of 86 slices shown, 15 images]
[im 5/86  soft-tissue]
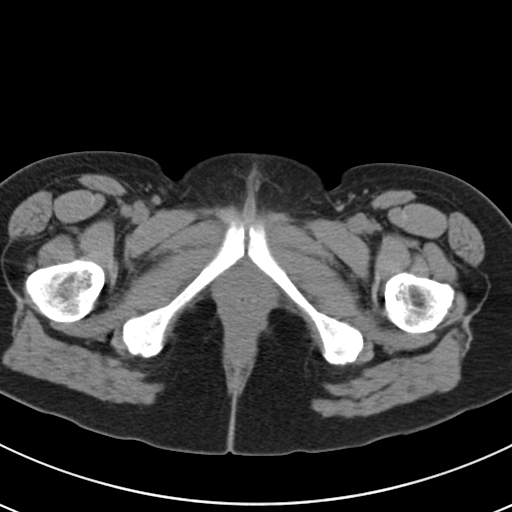
[im 5/86  bone]
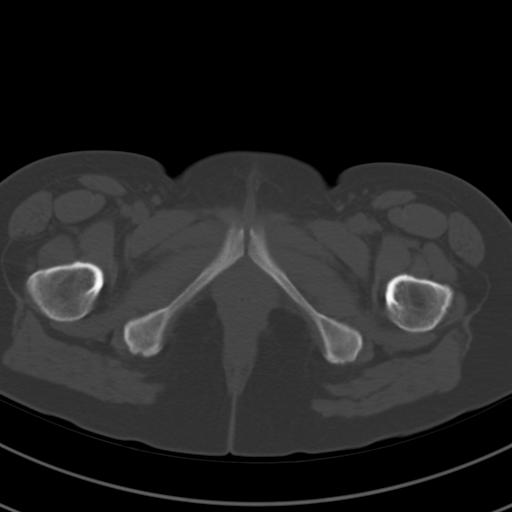
[im 10/86  soft-tissue]
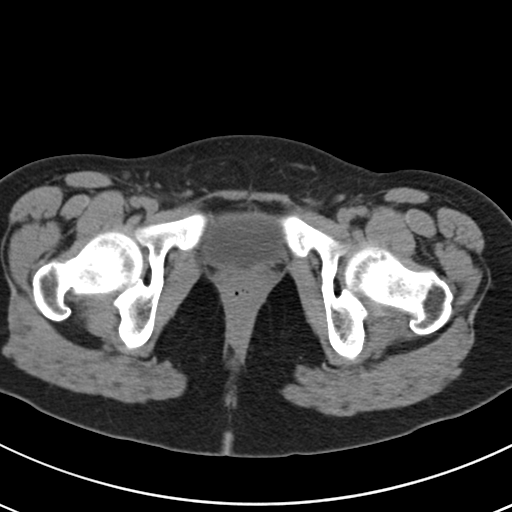
[im 19/86  soft-tissue]
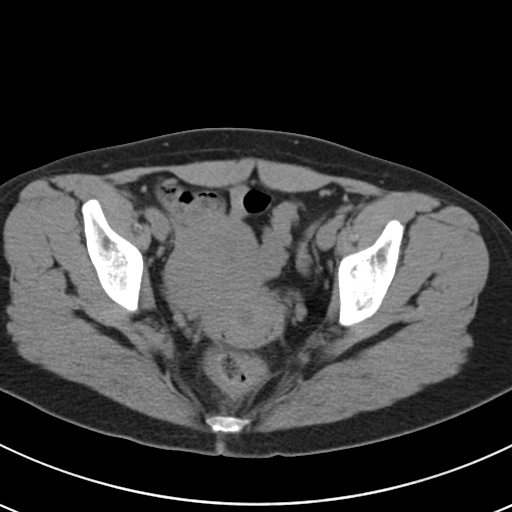
[im 24/86  soft-tissue]
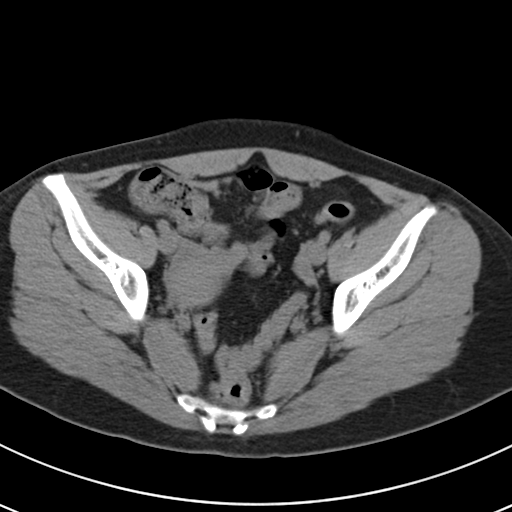
[im 29/86  soft-tissue]
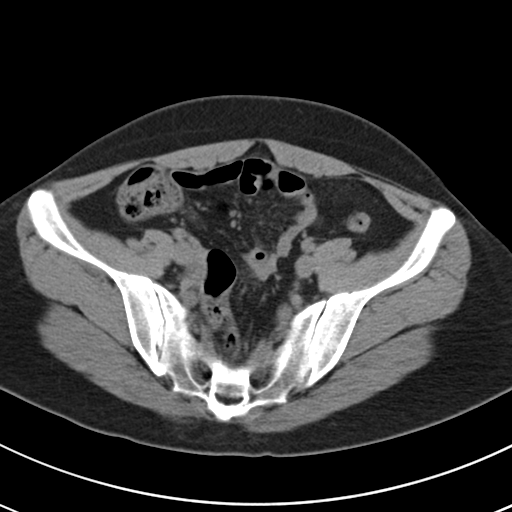
[im 38/86  soft-tissue]
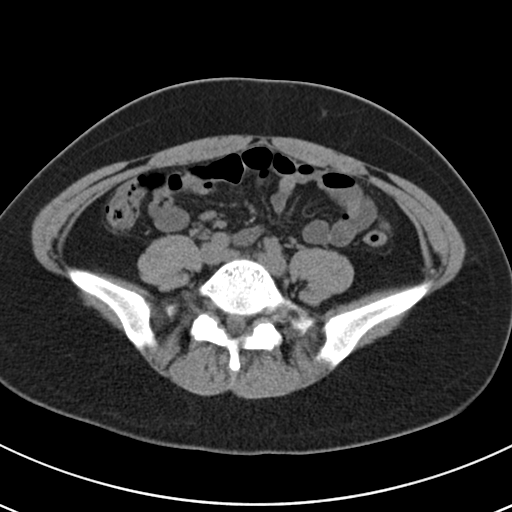
[im 43/86  soft-tissue]
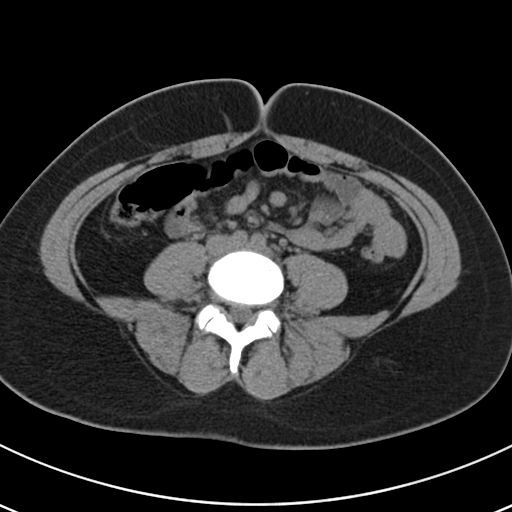
[im 48/86  soft-tissue]
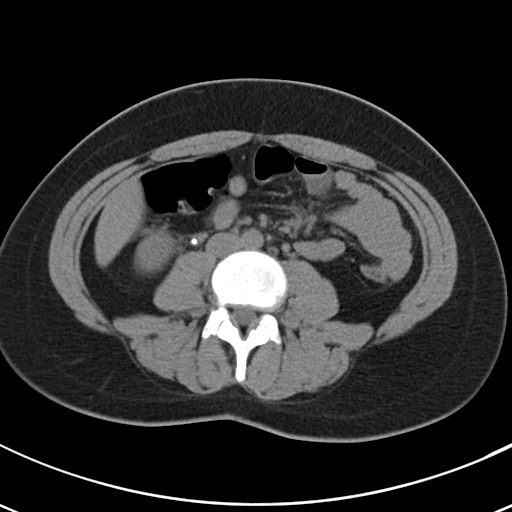
[im 57/86  soft-tissue]
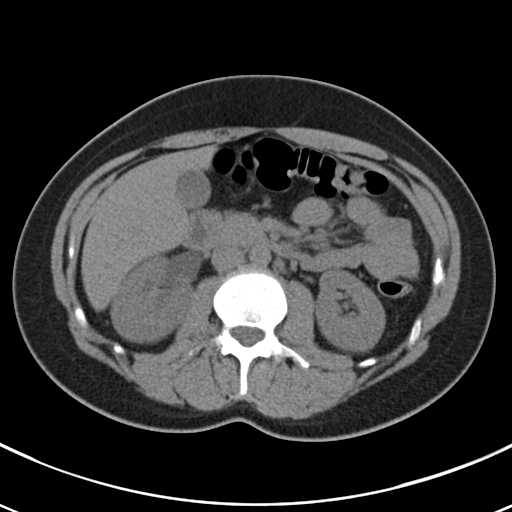
[im 57/86  bone]
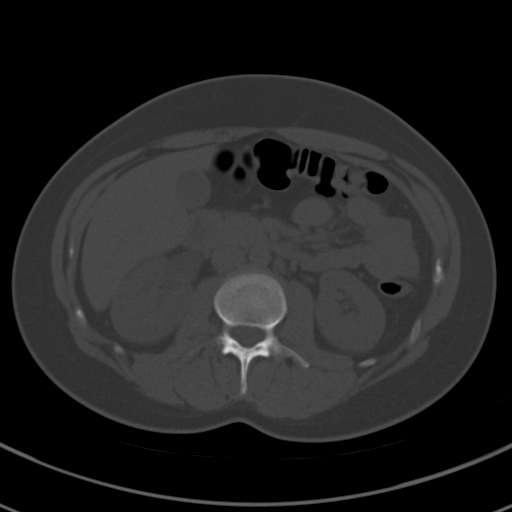
[im 62/86  soft-tissue]
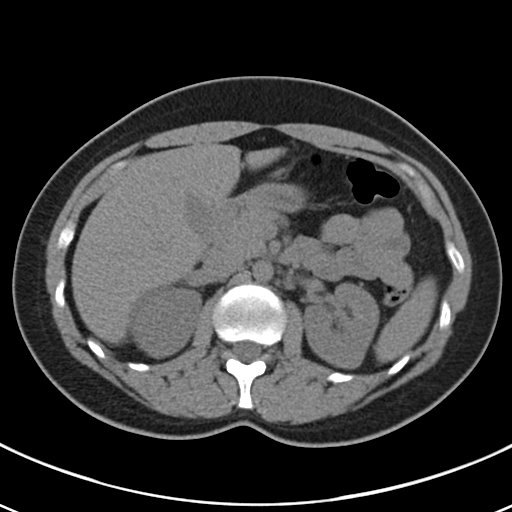
[im 67/86  soft-tissue]
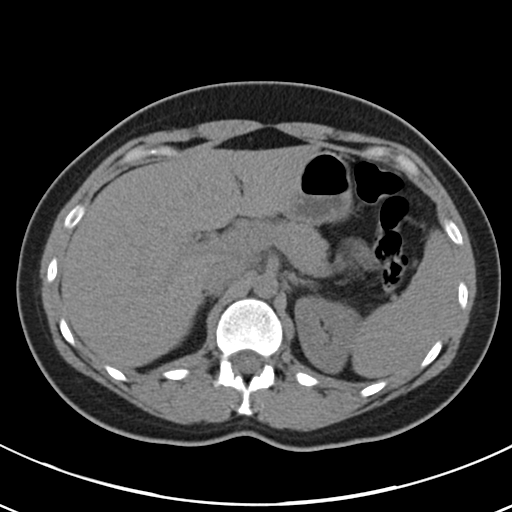
[im 76/86  soft-tissue]
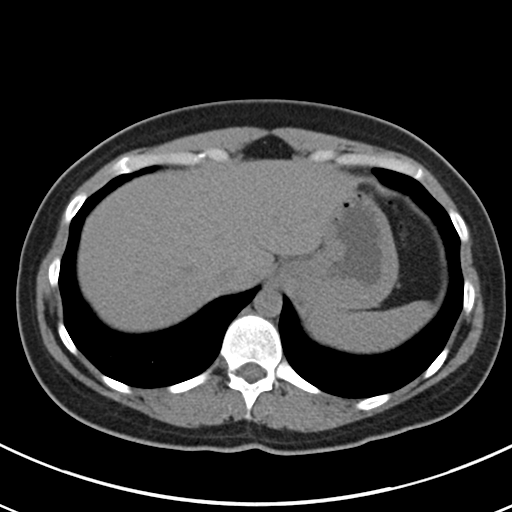
[im 81/86  soft-tissue]
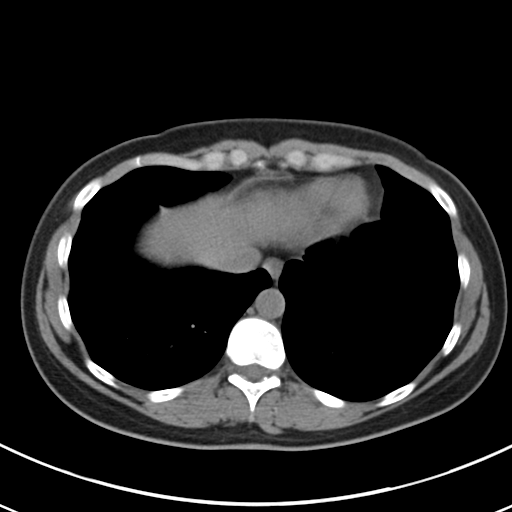

[Series 5: coronal · coronal · 0.74mm/px · 3 of 111 slices shown]
[im 37/111  soft-tissue]
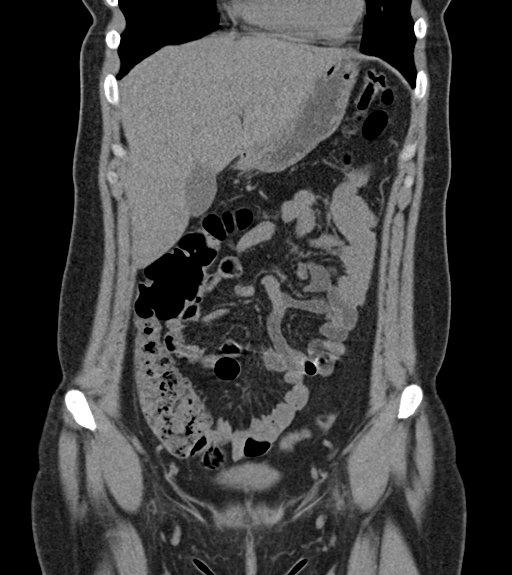
[im 49/111  soft-tissue]
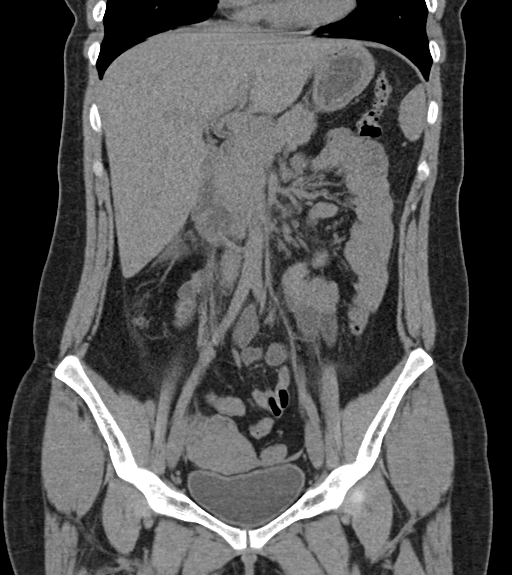
[im 62/111  soft-tissue]
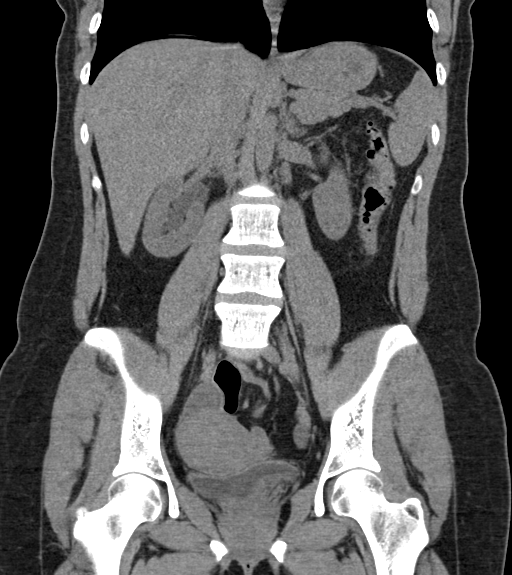

[16 of 46 positions shown; findings below may reference images not displayed]

FINDINGS: Lower chest: No worrisome lung base abnormality noted. Heart size
within normal limits.

Hepatobiliary: Taking into account limitation by non contrast
imaging, no worrisome hepatic lesion. No calcified gallstone or
common bile duct stone.

Pancreas: Taking into account limitation by non contrast imaging, no
worrisome pancreatic mass or inflammation.

Spleen: Taking into account limitation by non contrast imaging, no
splenic mass or enlargement.

Adrenals/Urinary Tract: Proximal right ureteral 4 x 4 x 5 mm
obstructing stone located 18 cm proximal to the right ureteral
vesicle junction is causing moderate right-sided hydronephrosis. 5
mm right lower pole nonobstructing stone. Taking into account
limitation by non contrast imaging, no worrisome renal, adrenal or
urinary bladder lesion.

Stomach/Bowel: No extraluminal bowel inflammatory process. Portions
of the stomach, small bowel and colon are under distended slightly
limiting evaluation.

Vascular/Lymphatic: No abdominal aortic aneurysm. Scattered normal
size lymph nodes.

Reproductive: Right ovary larger than the left but within the range
of normal limits given patient's age. Uterus tilted to the right.

Other: No free air or bowel containing hernia.

Musculoskeletal: No worrisome osseous abnormality.
IMPRESSION: 1. Proximal right ureteral 4 x 4 x 5 mm obstructing stone located 18
cm proximal to the right ureteral vesicle junction is causing
moderate right-sided hydronephrosis. 5 mm right lower pole
nonobstructing stone.
2. No extraluminal bowel inflammatory process noted.
3. Right ovary larger than the left but within the range of normal
limits given patient's age.

## 2020-06-07 IMAGING — CR DG ABDOMEN 1V
1 series · 1 of 1 positions shown · non-contrast
Comparison: CT abdomen pelvis 10/04/2018

CLINICAL DATA: Preprocedural exam.

EXAM:
ABDOMEN - 1 VIEW

[t abdomen supine]
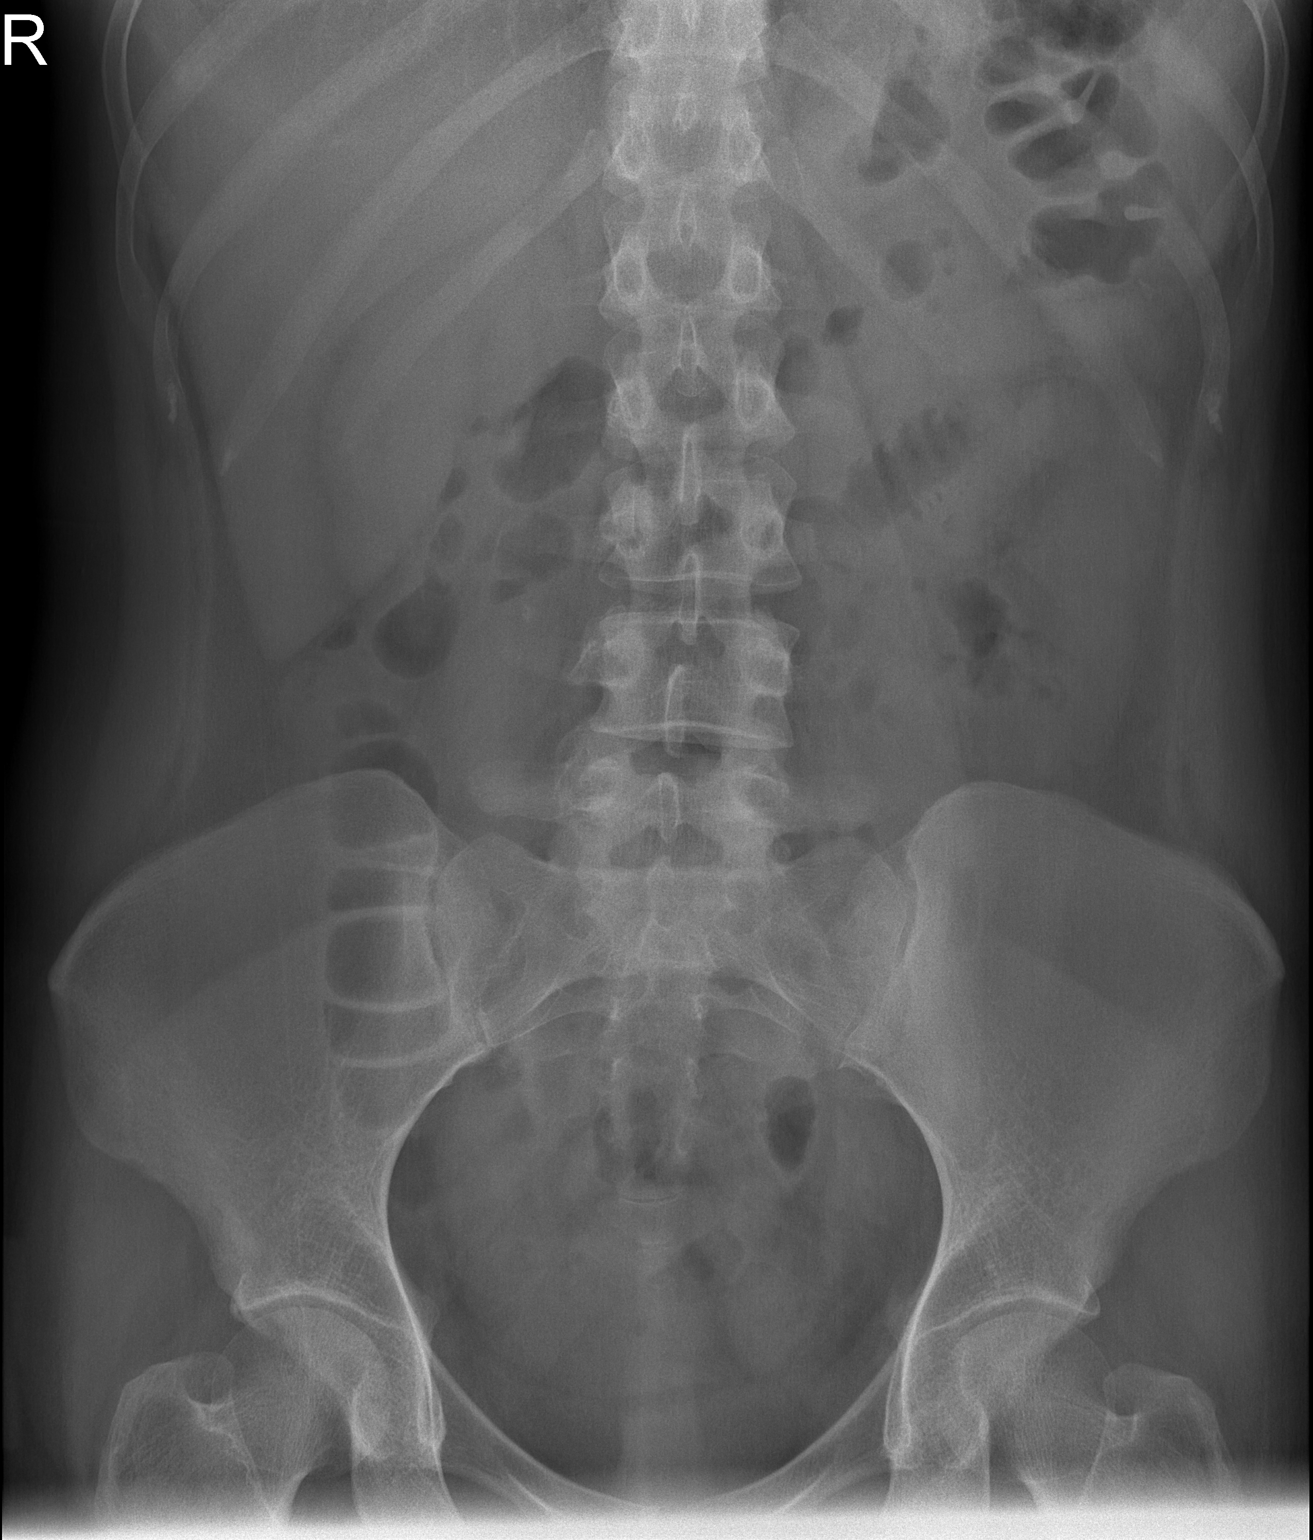

[1 of 1 positions shown; findings below may reference images not displayed]

FINDINGS: Nonobstructed bowel gas pattern. Unremarkable osseous structures. 4
mm calcific density projecting to the right of the L3-4 disc space,
potentially representing previously described ureteral stone.
Additionally previously visualized stone within the inferior pole of
the right kidney is not definitely visualized on current exam.
IMPRESSION: 4 mm calcific density adjacent to the right aspect of the L3-4
interspace potentially representing ureteral stone.

## 2020-06-14 DIAGNOSIS — Z113 Encounter for screening for infections with a predominantly sexual mode of transmission: Secondary | ICD-10-CM | POA: Diagnosis not present

## 2020-07-04 DIAGNOSIS — Z113 Encounter for screening for infections with a predominantly sexual mode of transmission: Secondary | ICD-10-CM | POA: Diagnosis not present

## 2020-07-19 DIAGNOSIS — Z3201 Encounter for pregnancy test, result positive: Secondary | ICD-10-CM | POA: Diagnosis not present

## 2020-07-19 DIAGNOSIS — Z3200 Encounter for pregnancy test, result unknown: Secondary | ICD-10-CM | POA: Diagnosis not present

## 2020-07-30 DIAGNOSIS — Z319 Encounter for procreative management, unspecified: Secondary | ICD-10-CM | POA: Diagnosis not present

## 2020-07-30 DIAGNOSIS — Z113 Encounter for screening for infections with a predominantly sexual mode of transmission: Secondary | ICD-10-CM | POA: Diagnosis not present

## 2020-07-30 DIAGNOSIS — E288 Other ovarian dysfunction: Secondary | ICD-10-CM | POA: Diagnosis not present

## 2020-08-01 ENCOUNTER — Other Ambulatory Visit: Payer: Self-pay | Admitting: Obstetrics and Gynecology

## 2020-08-24 DIAGNOSIS — E288 Other ovarian dysfunction: Secondary | ICD-10-CM | POA: Diagnosis not present

## 2020-08-24 DIAGNOSIS — Z319 Encounter for procreative management, unspecified: Secondary | ICD-10-CM | POA: Diagnosis not present

## 2020-09-13 DIAGNOSIS — Z113 Encounter for screening for infections with a predominantly sexual mode of transmission: Secondary | ICD-10-CM | POA: Diagnosis not present

## 2020-10-01 DIAGNOSIS — E039 Hypothyroidism, unspecified: Secondary | ICD-10-CM | POA: Diagnosis not present

## 2020-10-02 ENCOUNTER — Encounter (HOSPITAL_COMMUNITY): Payer: Self-pay

## 2020-10-02 ENCOUNTER — Encounter (HOSPITAL_COMMUNITY)
Admission: RE | Admit: 2020-10-02 | Discharge: 2020-10-02 | Disposition: A | Discharge status: Home / Self Care | Payer: BC Managed Care – PPO | Source: Ambulatory Visit | Attending: Obstetrics and Gynecology | Admitting: Obstetrics and Gynecology

## 2020-10-02 ENCOUNTER — Other Ambulatory Visit: Payer: Self-pay

## 2020-10-02 DIAGNOSIS — N96 Recurrent pregnancy loss: Secondary | ICD-10-CM | POA: Diagnosis not present

## 2020-10-02 DIAGNOSIS — Z01812 Encounter for preprocedural laboratory examination: Secondary | ICD-10-CM | POA: Insufficient documentation

## 2020-10-02 HISTORY — DX: Hypothyroidism, unspecified: E03.9

## 2020-10-02 HISTORY — DX: Depression, unspecified: F32.A

## 2020-10-02 LAB — CBC
HCT: 44.6 % (ref 36.0–46.0)
Hemoglobin: 14.9 g/dL (ref 12.0–15.0)
MCH: 29.2 pg (ref 26.0–34.0)
MCHC: 33.4 g/dL (ref 30.0–36.0)
MCV: 87.5 fL (ref 80.0–100.0)
Platelets: 284 10*3/uL (ref 150–400)
RBC: 5.1 MIL/uL (ref 3.87–5.11)
RDW: 12.3 % (ref 11.5–15.5)
WBC: 16.1 10*3/uL — ABNORMAL HIGH (ref 4.0–10.5)
nRBC: 0 % (ref 0.0–0.2)

## 2020-10-02 NOTE — Progress Notes (Addendum)
Anesthesia Review:  PCP: DR Aloha Gell  Cardiologist : no  Chest x-ray : EKG : Echo : Stress test: Cardiac Cath :  Activity level: yes  Sleep Study/ CPAP : Fasting Blood Sugar :      / Checks Blood Sugar -- times a day:   Blood Thinner/ Instructions /Last Dose: ASA / Instructions/ Last Dose :  CBC done 10/02/20 routed to Dr Kerin Perna  Konrad Felix, PAC made aware of CBC with elevated white count done on 10/02/2020.   Called pt and LVMM for her to call us to ask her a question regarding medical history.

## 2020-10-02 NOTE — Progress Notes (Signed)
Anesthesia Review:  PCP: Cardiologist : none  Chest x-ray : EKG : Echo : Stress test: Cardiac Cath :  Activity level: can doa flight of stiars without difficulty  Sleep Study/ CPAP : Fasting Blood Sugar :      / Checks Blood Sugar -- times a day:   Blood Thinner/ Instructions /Last Dose: ASA / Instructions/ Last Dose :  CBC results done 10/02/20 routed to DR yalcinkaya.

## 2020-10-02 NOTE — Progress Notes (Signed)
Ariel [name]  10/02/2020      Your procedure is scheduled on 10/10/2020  Report to [facility].  Call this number if you have problems the morning of surgery:[phone]  OUR ADDRESS IS [address], WE ARE LOCATED [facility] WITH [facility].   Remember:  Do not eat food   after midnight.   NO SOLID FOOD AFTER MIDNIGHT THE NIGHT PRIOR TO SURGERY. NOTHING BY MOUTH EXCEPT CLEAR LIQUIDS UNTIL     0915am . PLEASE FINISH ENSURE DRINK PER SURGEON ORDER  WHICH NEEDS TO BE COMPLETED AT .0915am   Take these medicines the morning of surgery with A SIP OF WATER: none    Do not wear jewelry, make-up or nail polish.  Do not wear lotions, powders, or perfumes, or deoderant.  Do not shave 48 hours prior to surgery.  Men may shave face and neck.  Do not bring valuables to the hospital.  [facility] is not responsible for any belongings or valuables.  Contacts, dentures or bridgework may not be worn into surgery.  Leave your suitcase in the car.  After surgery it may be brought to your room.  For patients admitted to the hospital, discharge time will be determined by your treatment team.  Patients discharged the day of surgery will not be allowed to drive home.     Please read over the following fact sheets that you were given: [facility] - Preparing for Surgery Before surgery, you can play an important role.  Because skin is not sterile, your skin needs to be as free of germs as possible.  You can reduce the number of germs on your skin by washing with CHG (chlorahexidine gluconate) soap before surgery.  CHG is an antiseptic cleaner which kills germs and bonds with the skin to continue killing germs even after washing. Please DO NOT use if you have an allergy to CHG or antibacterial soaps.  If your skin becomes reddened/irritated stop using the CHG and inform your nurse when you arrive at Short Stay. Do not shave (including legs and  underarms) for at least 48 hours prior to the first CHG shower.  You may shave your face/neck. Please follow these instructions carefully:  1.  Shower with CHG Soap the night before surgery and the  morning of Surgery.  2.  If you choose to wash your hair, wash your hair first as usual with your  normal  shampoo.  3.  After you shampoo, rinse your hair and body thoroughly to remove the  shampoo.                           4.  Use CHG as you would any other liquid soap.  You can apply chg directly  to the skin and wash                       Gently with a scrungie or clean washcloth.  5.  Apply the CHG Soap to your body ONLY FROM THE NECK DOWN.   Do not use on face/ open                           Wound or open sores. Avoid contact with eyes, ears mouth and genitals (private parts).  Wash face,  Genitals (private parts) with your normal soap.             6.  Wash thoroughly, paying special attention to the area where your surgery  will be performed.  7.  Thoroughly rinse your body with warm water from the neck down.  8.  DO NOT shower/wash with your normal soap after using and rinsing off  the CHG Soap.                9.  Pat yourself dry with a clean towel.            10.  Wear clean pajamas.            11.  Place clean sheets on your bed the night of your first shower and do not  sleep with pets. Day of Surgery : Do not apply any lotions/deodorants the morning of surgery.  Please wear clean clothes to the hospital/surgery center.  FAILURE TO FOLLOW THESE INSTRUCTIONS MAY RESULT IN THE CANCELLATION OF YOUR SURGERY PATIENT SIGNATURE_________________________________  NURSE SIGNATURE__________________________________  ________________________________________________________________________

## 2020-10-08 ENCOUNTER — Other Ambulatory Visit (HOSPITAL_COMMUNITY)
Admission: RE | Admit: 2020-10-08 | Discharge: 2020-10-08 | Disposition: A | Discharge status: Home / Self Care | Payer: BC Managed Care – PPO | Source: Ambulatory Visit | Attending: Obstetrics and Gynecology | Admitting: Obstetrics and Gynecology

## 2020-10-08 DIAGNOSIS — Z20822 Contact with and (suspected) exposure to covid-19: Secondary | ICD-10-CM | POA: Insufficient documentation

## 2020-10-08 DIAGNOSIS — Z87442 Personal history of urinary calculi: Secondary | ICD-10-CM | POA: Diagnosis not present

## 2020-10-08 DIAGNOSIS — Z809 Family history of malignant neoplasm, unspecified: Secondary | ICD-10-CM | POA: Diagnosis not present

## 2020-10-08 DIAGNOSIS — N920 Excessive and frequent menstruation with regular cycle: Secondary | ICD-10-CM | POA: Diagnosis not present

## 2020-10-08 DIAGNOSIS — Z01812 Encounter for preprocedural laboratory examination: Secondary | ICD-10-CM | POA: Insufficient documentation

## 2020-10-08 DIAGNOSIS — F17210 Nicotine dependence, cigarettes, uncomplicated: Secondary | ICD-10-CM | POA: Diagnosis not present

## 2020-10-08 DIAGNOSIS — Z7982 Long term (current) use of aspirin: Secondary | ICD-10-CM | POA: Diagnosis not present

## 2020-10-08 DIAGNOSIS — D251 Intramural leiomyoma of uterus: Secondary | ICD-10-CM | POA: Diagnosis not present

## 2020-10-08 DIAGNOSIS — E039 Hypothyroidism, unspecified: Secondary | ICD-10-CM | POA: Diagnosis not present

## 2020-10-08 DIAGNOSIS — Z8249 Family history of ischemic heart disease and other diseases of the circulatory system: Secondary | ICD-10-CM | POA: Diagnosis not present

## 2020-10-08 DIAGNOSIS — Z823 Family history of stroke: Secondary | ICD-10-CM | POA: Diagnosis not present

## 2020-10-08 DIAGNOSIS — D252 Subserosal leiomyoma of uterus: Secondary | ICD-10-CM | POA: Diagnosis not present

## 2020-10-08 DIAGNOSIS — F419 Anxiety disorder, unspecified: Secondary | ICD-10-CM | POA: Diagnosis not present

## 2020-10-08 DIAGNOSIS — N96 Recurrent pregnancy loss: Secondary | ICD-10-CM | POA: Diagnosis not present

## 2020-10-08 DIAGNOSIS — N841 Polyp of cervix uteri: Secondary | ICD-10-CM | POA: Diagnosis not present

## 2020-10-08 DIAGNOSIS — Z79899 Other long term (current) drug therapy: Secondary | ICD-10-CM | POA: Diagnosis not present

## 2020-10-08 DIAGNOSIS — N803 Endometriosis of pelvic peritoneum: Secondary | ICD-10-CM | POA: Diagnosis not present

## 2020-10-08 LAB — SARS CORONAVIRUS 2 (TAT 6-24 HRS): SARS Coronavirus 2: NEGATIVE

## 2020-10-10 ENCOUNTER — Ambulatory Visit (HOSPITAL_BASED_OUTPATIENT_CLINIC_OR_DEPARTMENT_OTHER): Payer: BC Managed Care – PPO | Admitting: Certified Registered"

## 2020-10-10 ENCOUNTER — Ambulatory Visit (HOSPITAL_BASED_OUTPATIENT_CLINIC_OR_DEPARTMENT_OTHER)
Admission: RE | Admit: 2020-10-10 | Discharge: 2020-10-10 | Disposition: A | Discharge status: Home / Self Care | Payer: BC Managed Care – PPO | Attending: Obstetrics and Gynecology | Admitting: Obstetrics and Gynecology

## 2020-10-10 ENCOUNTER — Encounter (HOSPITAL_BASED_OUTPATIENT_CLINIC_OR_DEPARTMENT_OTHER): Payer: Self-pay | Admitting: Obstetrics and Gynecology

## 2020-10-10 ENCOUNTER — Ambulatory Visit (HOSPITAL_BASED_OUTPATIENT_CLINIC_OR_DEPARTMENT_OTHER): Payer: BC Managed Care – PPO | Admitting: Physician Assistant

## 2020-10-10 ENCOUNTER — Other Ambulatory Visit: Payer: Self-pay

## 2020-10-10 ENCOUNTER — Encounter (HOSPITAL_BASED_OUTPATIENT_CLINIC_OR_DEPARTMENT_OTHER)
Admission: RE | Disposition: A | Discharge status: Home / Self Care | Payer: Self-pay | Source: Home / Self Care | Attending: Obstetrics and Gynecology

## 2020-10-10 DIAGNOSIS — F419 Anxiety disorder, unspecified: Secondary | ICD-10-CM | POA: Insufficient documentation

## 2020-10-10 DIAGNOSIS — N841 Polyp of cervix uteri: Secondary | ICD-10-CM | POA: Diagnosis not present

## 2020-10-10 DIAGNOSIS — E039 Hypothyroidism, unspecified: Secondary | ICD-10-CM | POA: Diagnosis not present

## 2020-10-10 DIAGNOSIS — Z7982 Long term (current) use of aspirin: Secondary | ICD-10-CM | POA: Insufficient documentation

## 2020-10-10 DIAGNOSIS — Z809 Family history of malignant neoplasm, unspecified: Secondary | ICD-10-CM | POA: Insufficient documentation

## 2020-10-10 DIAGNOSIS — N920 Excessive and frequent menstruation with regular cycle: Secondary | ICD-10-CM | POA: Insufficient documentation

## 2020-10-10 DIAGNOSIS — Z823 Family history of stroke: Secondary | ICD-10-CM | POA: Diagnosis not present

## 2020-10-10 DIAGNOSIS — N96 Recurrent pregnancy loss: Secondary | ICD-10-CM | POA: Diagnosis not present

## 2020-10-10 DIAGNOSIS — Z79899 Other long term (current) drug therapy: Secondary | ICD-10-CM | POA: Diagnosis not present

## 2020-10-10 DIAGNOSIS — Z20822 Contact with and (suspected) exposure to covid-19: Secondary | ICD-10-CM | POA: Diagnosis not present

## 2020-10-10 DIAGNOSIS — D251 Intramural leiomyoma of uterus: Secondary | ICD-10-CM | POA: Insufficient documentation

## 2020-10-10 DIAGNOSIS — Z87442 Personal history of urinary calculi: Secondary | ICD-10-CM | POA: Diagnosis not present

## 2020-10-10 DIAGNOSIS — N803 Endometriosis of pelvic peritoneum: Secondary | ICD-10-CM | POA: Diagnosis not present

## 2020-10-10 DIAGNOSIS — E785 Hyperlipidemia, unspecified: Secondary | ICD-10-CM | POA: Diagnosis not present

## 2020-10-10 DIAGNOSIS — D252 Subserosal leiomyoma of uterus: Secondary | ICD-10-CM | POA: Diagnosis not present

## 2020-10-10 DIAGNOSIS — Z8249 Family history of ischemic heart disease and other diseases of the circulatory system: Secondary | ICD-10-CM | POA: Diagnosis not present

## 2020-10-10 DIAGNOSIS — D259 Leiomyoma of uterus, unspecified: Secondary | ICD-10-CM | POA: Diagnosis not present

## 2020-10-10 DIAGNOSIS — F17210 Nicotine dependence, cigarettes, uncomplicated: Secondary | ICD-10-CM | POA: Diagnosis not present

## 2020-10-10 HISTORY — PX: HYSTEROSCOPY: SHX211

## 2020-10-10 HISTORY — PX: LAPAROSCOPIC GELPORT ASSISTED MYOMECTOMY: SHX6549

## 2020-10-10 LAB — TYPE AND SCREEN
ABO/RH(D): O POS
Antibody Screen: NEGATIVE

## 2020-10-10 LAB — GLUCOSE, CAPILLARY
Glucose-Capillary: 145 mg/dL — ABNORMAL HIGH (ref 70–99)
Glucose-Capillary: 64 mg/dL — ABNORMAL LOW (ref 70–99)
Glucose-Capillary: 119 mg/dL — ABNORMAL HIGH (ref 70–99)

## 2020-10-10 LAB — ABO/RH: ABO/RH(D): O POS

## 2020-10-10 LAB — POCT PREGNANCY, URINE: Preg Test, Ur: NEGATIVE

## 2020-10-10 SURGERY — LAPAROSCOPIC GELPORT ASSISTED MYOMECTOMY
Anesthesia: General | Site: Vagina

## 2020-10-10 MED ORDER — ONDANSETRON HCL 4 MG/2ML IJ SOLN
INTRAMUSCULAR | Status: AC
  Filled 2020-10-10: qty 2

## 2020-10-10 MED ORDER — ROCURONIUM BROMIDE 10 MG/ML (PF) SYRINGE
PREFILLED_SYRINGE | INTRAVENOUS | Status: AC
  Filled 2020-10-10: qty 10

## 2020-10-10 MED ORDER — OXYCODONE HCL 5 MG PO TABS
5.0000 mg | ORAL_TABLET | Freq: Once | ORAL | Status: AC | PRN
  Administered 2020-10-10: 5 mg via ORAL

## 2020-10-10 MED ORDER — SODIUM CHLORIDE 0.9 % IR SOLN
Status: DC | PRN
  Administered 2020-10-10: 2700 mL

## 2020-10-10 MED ORDER — ACETAMINOPHEN 160 MG/5ML PO SOLN: 325.0000 mg | ORAL | Status: DC | PRN

## 2020-10-10 MED ORDER — WHITE PETROLATUM EX OINT
TOPICAL_OINTMENT | CUTANEOUS | Status: AC
  Filled 2020-10-10: qty 5

## 2020-10-10 MED ORDER — ONDANSETRON HCL 4 MG/2ML IJ SOLN
INTRAMUSCULAR | Status: DC | PRN
  Administered 2020-10-10: 4 mg via INTRAVENOUS

## 2020-10-10 MED ORDER — FENTANYL CITRATE (PF) 100 MCG/2ML IJ SOLN
INTRAMUSCULAR | Status: AC
  Filled 2020-10-10: qty 2

## 2020-10-10 MED ORDER — KETOROLAC TROMETHAMINE 30 MG/ML IJ SOLN
INTRAMUSCULAR | Status: DC | PRN
  Administered 2020-10-10: 30 mg via INTRAVENOUS

## 2020-10-10 MED ORDER — MIDAZOLAM HCL 2 MG/2ML IJ SOLN
INTRAMUSCULAR | Status: DC | PRN
  Administered 2020-10-10: 2 mg via INTRAVENOUS

## 2020-10-10 MED ORDER — SUGAMMADEX SODIUM 200 MG/2ML IV SOLN
INTRAVENOUS | Status: DC | PRN
  Administered 2020-10-10: 140 mg via INTRAVENOUS

## 2020-10-10 MED ORDER — PROPOFOL 10 MG/ML IV BOLUS
INTRAVENOUS | Status: DC | PRN
  Administered 2020-10-10: 200 mg via INTRAVENOUS

## 2020-10-10 MED ORDER — METHYLENE BLUE 0.5 % INJ SOLN
INTRAVENOUS | Status: DC | PRN
  Administered 2020-10-10: 1 mL via SUBMUCOSAL

## 2020-10-10 MED ORDER — POVIDONE-IODINE 10 % EX SWAB: 2.0000 "application " | Freq: Once | CUTANEOUS | Status: DC

## 2020-10-10 MED ORDER — MEPERIDINE HCL 25 MG/ML IJ SOLN: 6.2500 mg | INTRAMUSCULAR | Status: DC | PRN

## 2020-10-10 MED ORDER — PHENYLEPHRINE 40 MCG/ML (10ML) SYRINGE FOR IV PUSH (FOR BLOOD PRESSURE SUPPORT)
PREFILLED_SYRINGE | INTRAVENOUS | Status: AC
  Filled 2020-10-10: qty 10

## 2020-10-10 MED ORDER — CHLORHEXIDINE GLUCONATE 0.12 % MT SOLN: 15.0000 mL | Freq: Once | OROMUCOSAL | Status: DC

## 2020-10-10 MED ORDER — DEXAMETHASONE SODIUM PHOSPHATE 10 MG/ML IJ SOLN
INTRAMUSCULAR | Status: AC
  Filled 2020-10-10: qty 1

## 2020-10-10 MED ORDER — DEXTROSE 50 % IV SOLN
25.0000 g | Freq: Once | INTRAVENOUS | Status: AC
  Administered 2020-10-10: 25 g via INTRAVENOUS

## 2020-10-10 MED ORDER — SODIUM CHLORIDE 0.9 % IR SOLN
Status: DC | PRN
  Administered 2020-10-10: 1000 mL

## 2020-10-10 MED ORDER — KETOROLAC TROMETHAMINE 30 MG/ML IJ SOLN
INTRAMUSCULAR | Status: AC
  Filled 2020-10-10: qty 1

## 2020-10-10 MED ORDER — DEXTROSE 50 % IV SOLN
INTRAVENOUS | Status: AC
  Filled 2020-10-10: qty 50

## 2020-10-10 MED ORDER — VASOPRESSIN 20 UNIT/ML IV SOLN
INTRAVENOUS | Status: DC | PRN
  Administered 2020-10-10: 40 mL via INTRAMUSCULAR

## 2020-10-10 MED ORDER — FENTANYL CITRATE (PF) 100 MCG/2ML IJ SOLN: 25.0000 ug | INTRAMUSCULAR | Status: DC | PRN

## 2020-10-10 MED ORDER — LACTATED RINGERS IV SOLN
INTRAVENOUS | Status: DC
  Administered 2020-10-10: 10 mL via INTRAVENOUS

## 2020-10-10 MED ORDER — ROCURONIUM BROMIDE 10 MG/ML (PF) SYRINGE
PREFILLED_SYRINGE | INTRAVENOUS | Status: DC | PRN
  Administered 2020-10-10: 50 mg via INTRAVENOUS
  Administered 2020-10-10: 10 mg via INTRAVENOUS

## 2020-10-10 MED ORDER — MIDAZOLAM HCL 2 MG/2ML IJ SOLN
INTRAMUSCULAR | Status: AC
  Filled 2020-10-10: qty 2

## 2020-10-10 MED ORDER — OXYCODONE HCL 5 MG/5ML PO SOLN: 5.0000 mg | Freq: Once | ORAL | Status: AC | PRN

## 2020-10-10 MED ORDER — LIDOCAINE 2% (20 MG/ML) 5 ML SYRINGE
INTRAMUSCULAR | Status: DC | PRN
  Administered 2020-10-10: 80 mg via INTRAVENOUS

## 2020-10-10 MED ORDER — DEXAMETHASONE SODIUM PHOSPHATE 10 MG/ML IJ SOLN
INTRAMUSCULAR | Status: DC | PRN
  Administered 2020-10-10 (×2): 5 mg via INTRAVENOUS

## 2020-10-10 MED ORDER — ONDANSETRON HCL 4 MG PO TABS: 4.0000 mg | ORAL_TABLET | Freq: Every day | ORAL | 1 refills | Status: AC | PRN

## 2020-10-10 MED ORDER — OXYCODONE-ACETAMINOPHEN 7.5-325 MG PO TABS
1.0000 | ORAL_TABLET | ORAL | 0 refills | Status: DC | PRN
Start: 2020-10-10 — End: 2021-10-15

## 2020-10-10 MED ORDER — ONDANSETRON HCL 4 MG/2ML IJ SOLN
4.0000 mg | Freq: Once | INTRAMUSCULAR | Status: AC | PRN
  Administered 2020-10-10: 4 mg via INTRAVENOUS

## 2020-10-10 MED ORDER — BUPIVACAINE-EPINEPHRINE 0.5% -1:200000 IJ SOLN
INTRAMUSCULAR | Status: DC | PRN
  Administered 2020-10-10: 20 mL

## 2020-10-10 MED ORDER — OXYCODONE HCL 5 MG PO TABS
ORAL_TABLET | ORAL | Status: AC
  Filled 2020-10-10: qty 1

## 2020-10-10 MED ORDER — FENTANYL CITRATE (PF) 100 MCG/2ML IJ SOLN
INTRAMUSCULAR | Status: DC | PRN
  Administered 2020-10-10 (×2): 50 ug via INTRAVENOUS
  Administered 2020-10-10 (×2): 25 ug via INTRAVENOUS

## 2020-10-10 MED ORDER — LIDOCAINE HCL (PF) 2 % IJ SOLN
INTRAMUSCULAR | Status: AC
  Filled 2020-10-10: qty 5

## 2020-10-10 MED ORDER — ACETAMINOPHEN 325 MG PO TABS: 325.0000 mg | ORAL_TABLET | ORAL | Status: DC | PRN

## 2020-10-10 MED ORDER — ORAL CARE MOUTH RINSE: 15.0000 mL | Freq: Once | OROMUCOSAL | Status: DC

## 2020-10-10 SURGICAL SUPPLY — 87 items
ADH SKN CLS APL DERMABOND .7 (GAUZE/BANDAGES/DRESSINGS) ×2
BAG RETRIEVAL 10 (BASKET)
BIPOLAR CUTTING LOOP 21FR (ELECTRODE)
BLADE EXTENDED COATED 6.5IN (ELECTRODE) IMPLANT
BLADE HEX COATED 2.75 (ELECTRODE) ×3 IMPLANT
BLADE SURG 10 STRL SS (BLADE) ×4 IMPLANT
BRR ADH 6X5 SEPRAFILM 1 SHT (MISCELLANEOUS) ×4
CABLE HIGH FREQUENCY MONO STRZ (ELECTRODE) ×3 IMPLANT
CANISTER SUCT 3000ML PPV (MISCELLANEOUS) ×3 IMPLANT
CANNULA CURETTE W/SYR 6 (CANNULA) IMPLANT
CANNULA CURETTE W/SYR 7 (CANNULA) IMPLANT
CATH NOVY CORNUAL CURVED 5.0 (CATHETERS) IMPLANT
CATH ROBINSON RED A/P 16FR (CATHETERS) ×3 IMPLANT
CLEANER CAUTERY TIP 5X5 PAD (MISCELLANEOUS) ×2 IMPLANT
COVER MAYO STAND STRL (DRAPES) ×3 IMPLANT
COVER WAND RF STERILE (DRAPES) ×3 IMPLANT
DERMABOND ADVANCED (GAUZE/BANDAGES/DRESSINGS) ×1
DERMABOND ADVANCED .7 DNX12 (GAUZE/BANDAGES/DRESSINGS) ×2 IMPLANT
DILATOR CANAL MILEX (MISCELLANEOUS) IMPLANT
DRAPE C-ARM 42X120 X-RAY (DRAPES) IMPLANT
DRSG OPSITE POSTOP 3X4 (GAUZE/BANDAGES/DRESSINGS) ×1 IMPLANT
DRSG TEGADERM 4X4.75 (GAUZE/BANDAGES/DRESSINGS) IMPLANT
DRSG TELFA 3X8 NADH (GAUZE/BANDAGES/DRESSINGS) ×3 IMPLANT
DURAPREP 26ML APPLICATOR (WOUND CARE) ×4 IMPLANT
ELECT BIPOLAR KNIFE NDL PTD 7M (ELECTRODE) IMPLANT
ELECT NDL TIP 2.8 STRL (NEEDLE) IMPLANT
ELECT NEEDLE TIP 2.8 STRL (NEEDLE) IMPLANT
ELECT REM PT RETURN 9FT ADLT (ELECTROSURGICAL) ×3
ELECTRODE REM PT RTRN 9FT ADLT (ELECTROSURGICAL) ×2 IMPLANT
GAUZE 4X4 16PLY RFD (DISPOSABLE) ×3 IMPLANT
GLOVE BIO SURGEON STRL SZ8 (GLOVE) ×6 IMPLANT
GOWN STRL REUS W/TWL XL LVL3 (GOWN DISPOSABLE) ×6 IMPLANT
HOLDER FOLEY CATH W/STRAP (MISCELLANEOUS) IMPLANT
IV NS IRRIG 3000ML ARTHROMATIC (IV SOLUTION) ×4 IMPLANT
KIT PROCEDURE FLUENT (KITS) ×2 IMPLANT
KIT TURNOVER CYSTO (KITS) ×3 IMPLANT
LOOP CUTTING BIPOLAR 21FR (ELECTRODE) IMPLANT
MANIPULATOR UTERINE 4.5 ZUMI (MISCELLANEOUS) ×3 IMPLANT
NDL SAFETY ECLIPSE 18X1.5 (NEEDLE) IMPLANT
NEEDLE HYPO 18GX1.5 SHARP (NEEDLE) ×3
NEEDLE HYPO 22GX1.5 SAFETY (NEEDLE) ×6 IMPLANT
NEEDLE INSUFFLATION 120MM (ENDOMECHANICALS) ×3 IMPLANT
NS IRRIG 500ML POUR BTL (IV SOLUTION) ×4 IMPLANT
PACK LAPAROSCOPY BASIN (CUSTOM PROCEDURE TRAY) ×3 IMPLANT
PACK TRENDGUARD 450 HYBRID PRO (MISCELLANEOUS) IMPLANT
PACK VAGINAL MINOR WOMEN LF (CUSTOM PROCEDURE TRAY) ×3 IMPLANT
PAD CLEANER CAUTERY TIP 5X5 (MISCELLANEOUS) ×1
PAD DRESSING TELFA 3X8 NADH (GAUZE/BANDAGES/DRESSINGS) ×2 IMPLANT
PAD OB MATERNITY 4.3X12.25 (PERSONAL CARE ITEMS) ×3 IMPLANT
PENCIL SMOKE EVACUATOR (MISCELLANEOUS) ×3 IMPLANT
SEPRAFILM MEMBRANE 5X6 (MISCELLANEOUS) ×2 IMPLANT
SET IRRIG Y TYPE TUR BLADDER L (SET/KITS/TRAYS/PACK) ×1 IMPLANT
SET SUCTION IRRIG HYDROSURG (IRRIGATION / IRRIGATOR) ×3 IMPLANT
SET TRI-LUMEN FLTR TB AIRSEAL (TUBING) IMPLANT
SET TUBE SMOKE EVAC HIGH FLOW (TUBING) ×1 IMPLANT
SHEARS HARMONIC ACE PLUS 36CM (ENDOMECHANICALS) IMPLANT
SPONGE LAP 18X18 RF (DISPOSABLE) ×1 IMPLANT
SPONGE LAP 4X18 RFD (DISPOSABLE) ×3 IMPLANT
STENT BALLN UTERINE 3CM 6FR (STENTS) IMPLANT
STENT BALLN UTERINE 4CM 6FR (STENTS) IMPLANT
STOPCOCK 4 WAY LG BORE MALE ST (IV SETS) ×1 IMPLANT
SUT MNCRL AB 4-0 PS2 18 (SUTURE) ×3 IMPLANT
SUT VIC AB 0 CT1 36 (SUTURE) IMPLANT
SUT VIC AB 2-0 CT1 (SUTURE) ×6 IMPLANT
SUT VIC AB 2-0 CT2 27 (SUTURE) IMPLANT
SUT VIC AB 2-0 UR6 27 (SUTURE) IMPLANT
SUT VIC AB 4-0 SH 27 (SUTURE) ×6
SUT VIC AB 4-0 SH 27XANBCTRL (SUTURE) ×4 IMPLANT
SYR 30ML LL (SYRINGE) ×3 IMPLANT
SYR 3ML 18GX1 1/2 (SYRINGE) ×1 IMPLANT
SYR 50ML LL SCALE MARK (SYRINGE) IMPLANT
SYR 5ML LL (SYRINGE) ×3 IMPLANT
SYR CONTROL 10ML LL (SYRINGE) ×6 IMPLANT
SYRINGE 3CC/18X1.5 ECLIPSE (MISCELLANEOUS) ×3 IMPLANT
SYS BAG RETRIEVAL 10MM (BASKET)
SYS LAPSCP GELPORT 120MM (MISCELLANEOUS) ×3
SYSTEM BAG RETRIEVAL 10MM (BASKET) IMPLANT
SYSTEM LAPSCP GELPORT 120MM (MISCELLANEOUS) IMPLANT
TOWEL OR 17X26 10 PK STRL BLUE (TOWEL DISPOSABLE) ×6 IMPLANT
TRAY FOLEY W/BAG SLVR 14FR LF (SET/KITS/TRAYS/PACK) ×3 IMPLANT
TRENDGUARD 450 HYBRID PRO PACK (MISCELLANEOUS) ×3
TROCAR BLADELESS OPT 5 100 (ENDOMECHANICALS) ×1 IMPLANT
TROCAR OPTI TIP 5M 100M (ENDOMECHANICALS) ×6 IMPLANT
TROCAR PORT AIRSEAL 5X120 (TROCAR) IMPLANT
TROCAR XCEL DIL TIP R 11M (ENDOMECHANICALS) IMPLANT
WARMER LAPAROSCOPE (MISCELLANEOUS) ×3 IMPLANT
WATER STERILE IRR 500ML POUR (IV SOLUTION) ×2 IMPLANT

## 2020-10-10 NOTE — Anesthesia Preprocedure Evaluation (Addendum)
Anesthesia Evaluation  Patient identified by MRN, date of birth, ID band Patient awake    Reviewed: Allergy & Precautions, NPO status , Patient's Chart, lab work & pertinent test results  History of Anesthesia Complications Negative for: history of anesthetic complications  Airway Mallampati: I  TM Distance: >3 FB Neck ROM: Full    Dental  (+) Teeth Intact   Pulmonary Current Smoker and Patient abstained from smoking.,    Pulmonary exam normal        Cardiovascular Normal cardiovascular exam  HLD   Neuro/Psych Anxiety Depression negative neurological ROS     GI/Hepatic negative GI ROS, Neg liver ROS,   Endo/Other  Hypothyroidism   Renal/GU negative Renal ROS  negative genitourinary   Musculoskeletal negative musculoskeletal ROS (+)   Abdominal   Peds  Hematology negative hematology ROS (+)   Anesthesia Other Findings   Reproductive/Obstetrics fibroids                            Anesthesia Physical Anesthesia Plan  ASA: II  Anesthesia Plan: General   Post-op Pain Management:    Induction: Intravenous  PONV Risk Score and Plan: 3 and Ondansetron, Dexamethasone, Treatment may vary due to age or medical condition and Midazolam  Airway Management Planned: Oral ETT  Additional Equipment: None  Intra-op Plan:   Post-operative Plan: Extubation in OR  Informed Consent: I have reviewed the patients History and Physical, chart, labs and discussed the procedure including the risks, benefits and alternatives for the proposed anesthesia with the patient or authorized representative who has indicated his/her understanding and acceptance.     Dental advisory given  Plan Discussed with:   Anesthesia Plan Comments:         Anesthesia Quick Evaluation

## 2020-10-10 NOTE — Op Note (Addendum)
Operative Note  Preoperative diagnosis: Uterine fibroids, menorrhagia, recurrent miscarriage  Postoperative diagnosis: Uterine fibroids, transmural and subserosal, stage I endometriosis of pelvic peritoneum, menorrhagia, recurrent miscarriage, endocervical polyps  Procedure: Laparoscopy, GelPort assisted myomectomy, chromotubation, hysteroscopy, polypectomy  Anesthesia: Gen. endotracheal  Complications: None  Estimated blood loss: 25 mL  Specimens: Uterine myomas (x 3; 90 g) and endocervical polyps to pathology  Findings: On examination under anesthesia, external genitalia, Bartholin's, Skene's, and urethra were normal. The vagina was normal. The cervix was nulliparous and appeared grossly normal. The uterus was 10-week size, firm and mobile with irregularities caused by myomas. It sounded to 9 cm.  On laparoscopy, upper abdomen, liver surface and diaphragm surfaces were normal. Gallbladder was normal. The appendix appeared normal.  The uterus contained posterior right 6 cm  transmural myoma with the medial aspect of it attached to the endometrium, which was not entered during the myomectomy.  The lateral aspect of it expanded into the right broad ligament.  There were also small less than 1 cm posterior subserosal pedunculated myomas which were also removed. The left tube and ovary appeared normal. The right tube and ovary appeared normal.  Although the tubes look normal proximally and distally with 4 out of 5 fimbria, we could not demonstrate flow of methylene blue into the tubes but we think this was an artifact and not due to occlusion There were brown lesions of endometriosis in the posterior cul-de-sac, on the left uterosacral ligament and over the course of the left ureter were also carefully ablated with needle electrode in 55 W of cutting current. Hysteroscopy showed a 0.5 x 0.5 cm upper endocervical canal polyp at 9:00 and 3:00 positions which were extirpated.  The rest of the  endometrial cavity appeared normal except for the indentation that was caused by the right transmural myoma after its removal.  Both tubal ostia were seen.  Description of the procedure: The patient was placed in dorsal supine position and general endotracheal anesthesia was given. 2 g of cefazolin were given intravenously for prophylaxis. Patient was placed in lithotomy position. She was prepped and draped in sterile manner.  The bladder was straight catheterized . A ZUMI catheter was placed into the uterine cavity. This was connected to a syringe containing diluted methylene blue solution which was used to define the endometrium during the myomectomy. The uterus sounded to 9 cm.  The surgeon was regloved and a surgical field was created on the abdomen.  After preemptive anesthesia of all surgical sites with 0.5% bupivacaine, a 5 mm intraumbilical skin incision was made and a Verress needle was inserted. Its correct location was confirmed. A pneumoperitoneum was created with carbon dioxide.  5 mm laparoscope with a 30 lens was inserted and video laparoscopy was started . A left lower quadrant 5 mm incision was made and ancillary trocar was placed under direct visualization. Above findings were noted.  A dilute solution of vasopressin (0.4 units per mL) was injected into the myometrium overlying the posterior right transmural myoma, until the myometrium blanched. A needle electrode with 12 W of cutting current  was used to make a transverse incision on the myometrium overlying the posterior right transmural 6 cm myoma. The myoma was grasped with tenaculum and dissection was started.  We then made a 4 cm transverse suprapubic incision to insert a GelPort. After dissection of the anatomic layers, the peritoneal cavity was entered. A GelPort was placed and the rest of the case was performed either using this port as  a laparoscopic port or as a minilaparotomy port.  The rest of the myoma had to be in situ  morcellated to eventually take it out of this 3 cm incision. This was carried out by blunt and sharp dissection and in situ morcellation was performed with #10 blade. The myoma defect was closed in 3 layers: The first layer was a deep myometrial suture of 2-0 Vicryl continuous interlocking suture, the second layer was a superficial myometrial layer of 2-0 Vicryl continuous suture. A 4-0 Vicryl continuous suture was placed on the serosa and the most superficial myometrium.  The pedunculated myomas were also extirpated and the base was coagulated with needle tip electrode.  We then used needle tip electrode in cutting current at 36 W to ablate the endometriotic lesions described above.  The suprapubic fascial incision was closed with 2-0 Vicryl continuous suture. Subcutaneous tissue was irrigated and aspirated good hemostasis was achieved. The abdomen and the pelvis was carefully inspected under laparoscopic visualization and the pelvis was copiously irrigated and aspirated. A slurry of 2 sheets of Seprafilm in 60 mL of normal saline was injected as an adhesion barrier into the pelvis. The gas was allowed to escape. The instrument and the lap pad count were correct. The trochars were removed. The skin incisions were approximated with 4-0 Monocryl in subcuticular sutures, including the 3 cm skin incision that belonged to the Crystal site. The surgeon then took his position between the patient's legs and hysteroscopy was started.  A Slimline 30 degree hysteroscope was used distention medium was saline and distention method was gravity.  Using hysteroscopic scissors the endocervical polyps were extirpated and removed and submitted to pathology.  Rest of the cavity was carefully inspected and above findings were noted.  The procedure was terminated.  The patient tolerated the procedure well and was transferred to recovery room in satisfactory condition.  SPECIAL NOTE: Because of the extent of the myometrial  incision during the uterine myomectomy, it is recommended that this patient deliver by a cesarean section with her future pregnancies.  Governor Specking, MD

## 2020-10-10 NOTE — Transfer of Care (Signed)
Immediate Anesthesia Transfer of Care Note  Patient: NASRA COUNCE  Procedure(s) Performed: Procedure(s) (LRB): LAPAROSCOPIC GELPORT ASSISTED MYOMECTOMY; ABLATION OF ENDOMETRIOSIS; CHROMOTUBATION (N/A) HYSTEROSCOPY, POLYPECTOMY (N/A)  Patient Location: PACU  Anesthesia Type: General  Level of Consciousness: awake, oriented, sedated and patient cooperative  Airway & Oxygen Therapy: Patient Spontanous Breathing and Patient connected to face mask oxygen  Post-op Assessment: Report given to PACU RN and Post -op Vital signs reviewed and stable  Post vital signs: Reviewed and stable  Complications: No apparent anesthesia complications  Last Vitals:  Vitals Value Taken Time  BP 112/73 10/10/20 1540  Temp    Pulse 91 10/10/20 1541  Resp 23 10/10/20 1541  SpO2 95 % 10/10/20 1541  Vitals shown include unvalidated device data.  Last Pain:  Vitals:   10/10/20 1042  TempSrc: Oral  PainSc: 0-No pain      Patients Stated Pain Goal: 6 (91/02/89 0228)  Complications: No complications documented.

## 2020-10-10 NOTE — Anesthesia Procedure Notes (Signed)
Procedure Name: Intubation Date/Time: 10/10/2020 1:49 PM Performed by: Suan Halter, CRNA Pre-anesthesia Checklist: Patient identified, Emergency Drugs available, Suction available and Patient being monitored Patient Re-evaluated:Patient Re-evaluated prior to induction Oxygen Delivery Method: Circle system utilized Preoxygenation: Pre-oxygenation with 100% oxygen Induction Type: IV induction Ventilation: Mask ventilation without difficulty Laryngoscope Size: Mac and 3 Grade View: Grade I Tube type: Oral Tube size: 7.0 mm Number of attempts: 1 Airway Equipment and Method: Stylet and Oral airway Placement Confirmation: ETT inserted through vocal cords under direct vision,  positive ETCO2 and breath sounds checked- equal and bilateral Secured at: 22 cm Tube secured with: Tape Dental Injury: Teeth and Oropharynx as per pre-operative assessment

## 2020-10-10 NOTE — Discharge Instructions (Signed)
Post Anesthesia Home Care Instructions  Activity: Get plenty of rest for the remainder of the day. A responsible individual must stay with you for 24 hours following the procedure.  For the next 24 hours, DO NOT: -Drive a car -Paediatric nurse -Drink alcoholic beverages -Take any medication unless instructed by your physician -Make any legal decisions or sign important papers.  Meals: Start with liquid foods such as gelatin or soup. Progress to regular foods as tolerated. Avoid greasy, spicy, heavy foods. If nausea and/or vomiting occur, drink only clear liquids until the nausea and/or vomiting subsides. Call your physician if vomiting continues.  Special Instructions/Symptoms: Your throat may feel dry or sore from the anesthesia or the breathing tube placed in your throat during surgery. If this causes discomfort, gargle with warm salt water. The discomfort should disappear within 24 hours.  If you had a scopolamine patch placed behind your ear for the management of post- operative nausea and/or vomiting:  1. The medication in the patch is effective for 72 hours, after which it should be removed.  Wrap patch in a tissue and discard in the trash. Wash hands thoroughly with soap and water. 2. You may remove the patch earlier than 72 hours if you experience unpleasant side effects which may include dry mouth, dizziness or visual disturbances. 3. Avoid touching the patch. Wash your hands with soap and water after contact with the patch.    DISCHARGE INSTRUCTIONS: Laparoscopy  The following instructions have been prepared to help you care for yourself upon your return home today.  Wound care:  Do not get the incision wet for the first 24 hours. The incision should be kept clean and dry.  The Band-Aids or dressings may be removed the day after surgery.  Should the incision become sore, red, and swollen after the first week, check with your doctor.  Personal hygiene:  Shower the day  after your procedure.  Activity and limitations:  Do NOT drive or operate any equipment today.  Do NOT lift anything more than 15 pounds for 2-3 weeks after surgery.  Do NOT rest in bed all day.  Walking is encouraged. Walk each day, starting slowly with 5-minute walks 3 or 4 times a day. Slowly increase the length of your walks.  Walk up and down stairs slowly.  Do NOT do strenuous activities, such as golfing, playing tennis, bowling, running, biking, weight lifting, gardening, mowing, or vacuuming for 2-4 weeks. Ask your doctor when it is okay to start.  Diet: Eat a light meal as desired this evening. You may resume your usual diet tomorrow.  Return to work: This is dependent on the type of work you do. For the most part you can return to a desk job within a week of surgery. If you are more active at work, please discuss this with your doctor.  What to expect after your surgery: You may have a slight burning sensation when you urinate on the first day. You may have a very small amount of blood in the urine. Expect to have a small amount of vaginal discharge/light bleeding for 1-2 weeks. It is not unusual to have abdominal soreness and bruising for up to 2 weeks. You may be tired and need more rest for about 1 week. You may experience shoulder pain for 24-72 hours. Lying flat in bed may relieve it.  Call your doctor for any of the following:  Develop a fever of 100.4 or greater  Inability to urinate 6 hours after  discharge from hospital  Severe pain not relieved by pain medications  Persistent of heavy bleeding at incision site  Redness or swelling around incision site after a week  Increasing nausea or vomiting  Next dose of Ibuprofen (Advil) after 9:30 PM tonight if needed.

## 2020-10-10 NOTE — H&P (Signed)
Ariel [name] is a 34 y.o. 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  She was diagnosed with fibroids because of abnormal uterine bleeding.  She also had an unexplained miscarriage at 8 to 10 weeks after fetal cardiac activity was confirmed following euploid embryo transfer.  She has been having monthly periods but with heavy flow and prolonged duration.  Patient would like to preserve her childbearing potential.  Pertinent Gynecological History: Menses: flow is excessive with use of 3 pads or tampons on heaviest days Bleeding: dysfunctional uterine bleeding Contraception: none DES exposure: denies Blood transfusions: none Sexually transmitted diseases: no past history  Last pap: normal  OB History: 1 spontaneous abortion at 8 to 10 weeks after euploid embryo transfer   Menstrual History: Menarche age: 89 No LMP recorded.    Past Medical History:  Diagnosis Date  . Anxiety   . Depression   . History of kidney stones   . Hyperlipidemia   . Hypoglycemia   . Hypothyroidism                     Past Surgical History:  Procedure Laterality Date  . DILATION AND EVACUATION  03/2020   preformed in Dr office  . EXTRACORPOREAL SHOCK WAVE LITHOTRIPSY Right 10/11/2018   Procedure: EXTRACORPOREAL SHOCK WAVE LITHOTRIPSY (ESWL);  Surgeon: Ardis Hughs, MD;  Location: [facility];  Service: Urology;  Laterality: Right;  . GANGLION CYST EXCISION Right 04/21/2017   Procedure: OPEN EXCISION DORSAL ULNAR GANGLION RIGHT WRIST;  Surgeon: Daryll Brod, MD;  Location: [place];  Service: Orthopedics;  Laterality: Right;  . REFRACTIVE SURGERY  2018  . wisdom teeth extraction    . WRIST ARTHROSCOPY WITH DEBRIDEMENT Right 04/21/2017   Procedure: ARTHROSCOPY RIGHT WRIST with debridement;  Surgeon: Daryll Brod, MD;  Location: [place];  Service: Orthopedics;  Laterality: Right;             Family History  Problem Relation Age  of Onset  . Cancer Father   . Heart failure Father   . Stroke Maternal Grandmother   . Cancer Maternal Grandfather   . Heart attack Paternal Grandfather    No hereditary disease.  No cancer of breast, ovary, uterus. No cutaneous leiomyomatosis or renal cell carcinoma.  Social History   Socioeconomic History  . Marital status: Single    Spouse name: Not on file  . Number of children: Not on file  . Years of education: Not on file  . Highest education level: Not on file  Occupational History  . Not on file  Tobacco Use  . Smoking status: Current Every Day Smoker    Packs/day: 0.25    Years: 18.00    Pack years: 4.50    Types: Cigarettes  . Smokeless tobacco: Never Used  Vaping Use  . Vaping Use: Never used  Substance and Sexual Activity  . Alcohol use: Yes    Comment: minimum to moderate 4-5 drinks per week   . Drug use: Not Currently    Types: Marijuana  . Sexual activity: Yes    Birth control/protection: Pill  Other Topics Concern  . Not on file  Social History Narrative  . Not on file   Social Determinants of Health   Financial Resource Strain:   . Difficulty of Paying Living Expenses: Not on file  Food Insecurity:   . Worried About Charity fundraiser in the Last Year: Not on file  .  Ran Out of Food in the Last Year: Not on file  Transportation Needs:   . Lack of Transportation (Medical): Not on file  . Lack of Transportation (Non-Medical): Not on file  Physical Activity:   . Days of Exercise per Week: Not on file  . Minutes of Exercise per Session: Not on file  Stress:   . Feeling of Stress : Not on file  Social Connections:   . Frequency of Communication with Friends and Family: Not on file  . Frequency of Social Gatherings with Friends and Family: Not on file  . Attends Religious Services: Not on file  . Active Member of Clubs or Organizations: Not on file  . Attends Archivist Meetings: Not on file  . Marital Status: Not on file  Intimate  Partner Violence:   . Fear of Current or Ex-Partner: Not on file  . Emotionally Abused: Not on file  . Physically Abused: Not on file  . Sexually Abused: Not on file    No Known Allergies  No current facility-administered medications on file prior to encounter.   Current Outpatient Medications on File Prior to Encounter  Medication Sig Dispense Refill  . aspirin EC 81 MG tablet Take 81 mg by mouth daily. Swallow whole.    . Coenzyme Q10 (CO Q-10) 100 MG CAPS Take 100 mg by mouth daily.    . diphenhydrAMINE (BENADRYL) 25 MG tablet Take 50 mg by mouth at bedtime.     Marland Kitchen levothyroxine (SYNTHROID) 50 MCG tablet Take 50 mcg by mouth daily before breakfast.    . multivitamin-lutein (OCUVITE-LUTEIN) CAPS capsule Take 1 capsule by mouth daily.    . Prenatal Vit-Fe Fumarate-FA (PRENATAL MULTIVITAMIN) TABS tablet Take 1 tablet by mouth daily at 12 noon.    . Vitamin D, Cholecalciferol, 1000 units TABS Take 1,000 Units by mouth daily.     . Vitamin E 180 MG (400 UNIT) CAPS Take 400 Units by mouth daily.     Marland Kitchen ALPRAZolam (XANAX) 0.5 MG tablet Take 1 tablet by mouth as needed. (Patient not taking: Reported on 10/01/2020)  0  . FLAXSEED, LINSEED, PO Take by mouth. (Patient not taking: Reported on 10/01/2020)    . MILK THISTLE EXTRACT PO Take by mouth. (Patient not taking: Reported on 10/01/2020)    . Multiple Vitamins-Minerals (ZINC PO) Take by mouth as needed. (Patient not taking: Reported on 10/01/2020)    . omega-3 acid ethyl esters (LOVAZA) 1 g capsule Take 2 capsules (2 g total) by mouth 2 (two) times daily. (Patient not taking: Reported on 10/01/2020) 120 capsule 2  . ondansetron (ZOFRAN ODT) 4 MG disintegrating tablet 4mg  ODT q4 hours prn nausea/vomit (Patient not taking: Reported on 10/01/2020) 10 tablet 0  . Probiotic Product (PROBIOTIC DAILY PO) Take by mouth. (Patient not taking: Reported on 10/01/2020)    . valACYclovir (VALTREX) 1000 MG tablet Take 1,000 mg by mouth 2 (two) times daily as  needed. (Patient not taking: Reported on 10/01/2020)  3     Review of Systems  Constitutional: Negative.   HENT: Negative.   Eyes: Negative.   Respiratory: Negative.   Cardiovascular: Negative.   Gastrointestinal: Negative.   Genitourinary: Negative.   Musculoskeletal: Negative.   Skin: Negative.   Neurological: Negative.   Endo/Heme/Allergies: Negative.   Psychiatric/Behavioral: Negative.      Physical Exam  BP 112/76   Pulse 78   Temp (!) 97 F (36.1 C) (Oral)   Resp 16   Ht 5\' 2"  (1.575  m)   Wt 63.6 kg   LMP 10/07/2020 (Exact Date) Comment: continues to bleed   SpO2 99%   BMI 25.66 kg/m  Constitutional: She is oriented to person, place, and time. She appears well-developed and well-nourished.  HENT:  Head: Normocephalic and atraumatic.  Nose: Nose normal.  Mouth/Throat: Oropharynx is clear and moist. No oropharyngeal exudate.  Eyes: Conjunctivae normal and EOM are normal. Pupils are equal, round, and reactive to light. No scleral icterus.  Neck: Normal range of motion. Neck supple. No tracheal deviation present. No thyromegaly present.  Cardiovascular: Normal rate.   Respiratory: Effort normal and breath sounds normal.  GI: Soft. Bowel sounds are normal. She exhibits no distension and no mass. There is no tenderness.  Lymphadenopathy:    She has no cervical adenopathy.  Neurological: She is alert and oriented to person, place, and time. She has normal reflexes.  Skin: Skin is warm.  Psychiatric: She has a normal mood and affect. Her behavior is normal. Judgment and thought content normal.    Assessment/Plan:  Intramural and subserosal uterine myomas, causing menorrhagia and pressure sensation. Preoperative for laparoscopic GelPort assisted myomectomy and hysteroscopy Benefits and risks of proposed procedures were discussed with the patient and her family member again.  Bowel prep instructions were given.  All of patient's questions were answered.  She verbalized  understanding.  She knows that she will need a cesarean delivery for future pregnancies, and that it is recommended she does not conceive for 2-3 months for uterus to heal.

## 2020-10-11 ENCOUNTER — Encounter (HOSPITAL_BASED_OUTPATIENT_CLINIC_OR_DEPARTMENT_OTHER): Payer: Self-pay | Admitting: Obstetrics and Gynecology

## 2020-10-11 LAB — SURGICAL PATHOLOGY

## 2020-10-11 NOTE — Anesthesia Postprocedure Evaluation (Signed)
Anesthesia Post Note  Patient: Ariel [name]  Procedure(s) Performed: LAPAROSCOPIC GELPORT ASSISTED MYOMECTOMY; ABLATION OF ENDOMETRIOSIS; CHROMOTUBATION (N/A Abdomen) HYSTEROSCOPY, POLYPECTOMY (N/A Vagina )     Patient location during evaluation: PACU Anesthesia Type: General Level of consciousness: awake and alert Pain management: pain level controlled Vital Signs Assessment: post-procedure vital signs reviewed and stable Respiratory status: spontaneous breathing, nonlabored ventilation and respiratory function stable Cardiovascular status: blood pressure returned to baseline and stable Postop Assessment: no apparent nausea or vomiting Anesthetic complications: no   No complications documented.  Last Vitals:  Vitals:   10/10/20 1631 10/10/20 1735  BP: 114/79 118/76  Pulse: 73 88  Resp: 10 12  Temp:  36.7 C  SpO2: 97% 100%    Last Pain:  Vitals:   10/10/20 1654  TempSrc:   PainSc: [place]

## 2020-10-20 DIAGNOSIS — Z20822 Contact with and (suspected) exposure to covid-19: Secondary | ICD-10-CM | POA: Diagnosis not present

## 2020-11-11 DIAGNOSIS — Z20822 Contact with and (suspected) exposure to covid-19: Secondary | ICD-10-CM | POA: Diagnosis not present

## 2020-11-13 DIAGNOSIS — E039 Hypothyroidism, unspecified: Secondary | ICD-10-CM | POA: Diagnosis not present

## 2020-11-21 DIAGNOSIS — Z6825 Body mass index (BMI) 25.0-25.9, adult: Secondary | ICD-10-CM | POA: Diagnosis not present

## 2020-11-21 DIAGNOSIS — Z01419 Encounter for gynecological examination (general) (routine) without abnormal findings: Secondary | ICD-10-CM | POA: Diagnosis not present

## 2020-11-21 DIAGNOSIS — Z23 Encounter for immunization: Secondary | ICD-10-CM | POA: Diagnosis not present

## 2020-12-10 DIAGNOSIS — Z3141 Encounter for fertility testing: Secondary | ICD-10-CM | POA: Diagnosis not present

## 2020-12-10 DIAGNOSIS — Z3181 Encounter for male factor infertility in female patient: Secondary | ICD-10-CM | POA: Diagnosis not present

## 2020-12-10 DIAGNOSIS — Z3183 Encounter for assisted reproductive fertility procedure cycle: Secondary | ICD-10-CM | POA: Diagnosis not present

## 2020-12-10 DIAGNOSIS — N96 Recurrent pregnancy loss: Secondary | ICD-10-CM | POA: Diagnosis not present

## 2020-12-10 DIAGNOSIS — N840 Polyp of corpus uteri: Secondary | ICD-10-CM | POA: Diagnosis not present

## 2020-12-10 DIAGNOSIS — N8500 Endometrial hyperplasia, unspecified: Secondary | ICD-10-CM | POA: Diagnosis not present

## 2020-12-26 DIAGNOSIS — Z113 Encounter for screening for infections with a predominantly sexual mode of transmission: Secondary | ICD-10-CM | POA: Diagnosis not present

## 2020-12-28 DIAGNOSIS — N841 Polyp of cervix uteri: Secondary | ICD-10-CM | POA: Diagnosis not present

## 2020-12-28 DIAGNOSIS — N8501 Benign endometrial hyperplasia: Secondary | ICD-10-CM | POA: Diagnosis not present

## 2020-12-28 DIAGNOSIS — N840 Polyp of corpus uteri: Secondary | ICD-10-CM | POA: Diagnosis not present

## 2021-01-21 DIAGNOSIS — Z3181 Encounter for male factor infertility in female patient: Secondary | ICD-10-CM | POA: Diagnosis not present

## 2021-01-21 DIAGNOSIS — N96 Recurrent pregnancy loss: Secondary | ICD-10-CM | POA: Diagnosis not present

## 2021-01-21 DIAGNOSIS — Z3183 Encounter for assisted reproductive fertility procedure cycle: Secondary | ICD-10-CM | POA: Diagnosis not present

## 2021-02-25 DIAGNOSIS — Z3201 Encounter for pregnancy test, result positive: Secondary | ICD-10-CM | POA: Diagnosis not present

## 2021-02-25 DIAGNOSIS — Z3200 Encounter for pregnancy test, result unknown: Secondary | ICD-10-CM | POA: Diagnosis not present

## 2021-02-27 DIAGNOSIS — Z3201 Encounter for pregnancy test, result positive: Secondary | ICD-10-CM | POA: Diagnosis not present

## 2021-02-27 DIAGNOSIS — Z3200 Encounter for pregnancy test, result unknown: Secondary | ICD-10-CM | POA: Diagnosis not present

## 2021-03-08 DIAGNOSIS — Z3200 Encounter for pregnancy test, result unknown: Secondary | ICD-10-CM | POA: Diagnosis not present

## 2021-03-27 DIAGNOSIS — O0900 Supervision of pregnancy with history of infertility, unspecified trimester: Secondary | ICD-10-CM | POA: Diagnosis not present

## 2021-04-09 DIAGNOSIS — Z3689 Encounter for other specified antenatal screening: Secondary | ICD-10-CM | POA: Diagnosis not present

## 2021-04-09 DIAGNOSIS — Z3A10 10 weeks gestation of pregnancy: Secondary | ICD-10-CM | POA: Diagnosis not present

## 2021-04-09 DIAGNOSIS — Z3481 Encounter for supervision of other normal pregnancy, first trimester: Secondary | ICD-10-CM | POA: Diagnosis not present

## 2021-04-09 DIAGNOSIS — O99281 Endocrine, nutritional and metabolic diseases complicating pregnancy, first trimester: Secondary | ICD-10-CM | POA: Diagnosis not present

## 2021-04-09 DIAGNOSIS — O09291 Supervision of pregnancy with other poor reproductive or obstetric history, first trimester: Secondary | ICD-10-CM | POA: Diagnosis not present

## 2021-04-09 LAB — OB RESULTS CONSOLE RPR: RPR: NONREACTIVE

## 2021-04-09 LAB — OB RESULTS CONSOLE RUBELLA ANTIBODY, IGM: Rubella: IMMUNE

## 2021-04-09 LAB — OB RESULTS CONSOLE HEPATITIS B SURFACE ANTIGEN: Hepatitis B Surface Ag: NEGATIVE

## 2021-04-09 LAB — OB RESULTS CONSOLE ABO/RH: RH Type: POSITIVE

## 2021-04-09 LAB — OB RESULTS CONSOLE GC/CHLAMYDIA
Chlamydia: NEGATIVE
Gonorrhea: NEGATIVE

## 2021-04-09 LAB — OB RESULTS CONSOLE VARICELLA ZOSTER ANTIBODY, IGG: Varicella: IMMUNE

## 2021-04-09 LAB — OB RESULTS CONSOLE ANTIBODY SCREEN: Antibody Screen: NEGATIVE

## 2021-04-09 LAB — OB RESULTS CONSOLE HIV ANTIBODY (ROUTINE TESTING): HIV: NONREACTIVE

## 2021-05-22 DIAGNOSIS — Z3A16 16 weeks gestation of pregnancy: Secondary | ICD-10-CM | POA: Diagnosis not present

## 2021-05-22 DIAGNOSIS — Z361 Encounter for antenatal screening for raised alphafetoprotein level: Secondary | ICD-10-CM | POA: Diagnosis not present

## 2021-05-22 DIAGNOSIS — O09292 Supervision of pregnancy with other poor reproductive or obstetric history, second trimester: Secondary | ICD-10-CM | POA: Diagnosis not present

## 2021-05-22 DIAGNOSIS — O99282 Endocrine, nutritional and metabolic diseases complicating pregnancy, second trimester: Secondary | ICD-10-CM | POA: Diagnosis not present

## 2021-06-12 DIAGNOSIS — O09292 Supervision of pregnancy with other poor reproductive or obstetric history, second trimester: Secondary | ICD-10-CM | POA: Diagnosis not present

## 2021-06-12 DIAGNOSIS — Z3482 Encounter for supervision of other normal pregnancy, second trimester: Secondary | ICD-10-CM | POA: Diagnosis not present

## 2021-06-12 DIAGNOSIS — Z3A19 19 weeks gestation of pregnancy: Secondary | ICD-10-CM | POA: Diagnosis not present

## 2021-06-12 DIAGNOSIS — O09529 Supervision of elderly multigravida, unspecified trimester: Secondary | ICD-10-CM | POA: Diagnosis not present

## 2021-06-21 DIAGNOSIS — N898 Other specified noninflammatory disorders of vagina: Secondary | ICD-10-CM | POA: Diagnosis not present

## 2021-06-21 DIAGNOSIS — O09292 Supervision of pregnancy with other poor reproductive or obstetric history, second trimester: Secondary | ICD-10-CM | POA: Diagnosis not present

## 2021-06-21 DIAGNOSIS — B373 Candidiasis of vulva and vagina: Secondary | ICD-10-CM | POA: Diagnosis not present

## 2021-06-21 DIAGNOSIS — Z3A20 20 weeks gestation of pregnancy: Secondary | ICD-10-CM | POA: Diagnosis not present

## 2021-08-14 LAB — OB RESULTS CONSOLE RPR: RPR: REACTIVE

## 2021-09-25 ENCOUNTER — Other Ambulatory Visit: Payer: Self-pay | Admitting: Obstetrics

## 2021-09-30 LAB — OB RESULTS CONSOLE GBS: GBS: NEGATIVE

## 2021-10-16 ENCOUNTER — Encounter (HOSPITAL_COMMUNITY): Payer: Self-pay | Admitting: *Deleted

## 2021-10-18 ENCOUNTER — Encounter (HOSPITAL_COMMUNITY)
Admission: RE | Admit: 2021-10-18 | Discharge: 2021-10-18 | Disposition: A | Discharge status: Home / Self Care | Payer: BC Managed Care – PPO | Source: Ambulatory Visit | Attending: Obstetrics | Admitting: Obstetrics

## 2021-10-18 ENCOUNTER — Other Ambulatory Visit: Payer: Self-pay | Admitting: Obstetrics

## 2021-10-18 ENCOUNTER — Other Ambulatory Visit: Payer: Self-pay

## 2021-10-18 DIAGNOSIS — Z01818 Encounter for other preprocedural examination: Secondary | ICD-10-CM | POA: Insufficient documentation

## 2021-10-18 LAB — CBC
HCT: 43.2 % (ref 36.0–46.0)
Hemoglobin: 14.3 g/dL (ref 12.0–15.0)
MCH: 28.8 pg (ref 26.0–34.0)
MCHC: 33.1 g/dL (ref 30.0–36.0)
MCV: 87.1 fL (ref 80.0–100.0)
Platelets: 232 10*3/uL (ref 150–400)
RBC: 4.96 MIL/uL (ref 3.87–5.11)
RDW: 13.2 % (ref 11.5–15.5)
WBC: 13.4 10*3/uL — ABNORMAL HIGH (ref 4.0–10.5)
nRBC: 0 % (ref 0.0–0.2)

## 2021-10-18 LAB — TYPE AND SCREEN
ABO/RH(D): O POS
Antibody Screen: NEGATIVE

## 2021-10-18 NOTE — Patient Instructions (Signed)
DELANIE TIRRELL  10/18/2021   Your procedure is scheduled on:  10/21/2021  Arrive at 5:00AM at Entrance C on [address] at [facility]  and [address]. You are invited to use the FREE valet parking or use the Visitor's parking deck.  Pick up the phone at the desk and dial [phone].  Call this number if you have problems the morning of surgery: [phone]  Remember:   Do not eat food:(After Midnight) Desps de medianoche.  Do not drink clear liquids: (After Midnight) Desps de medianoche.  Take these medicines the morning of surgery with A SIP OF WATER:  none   Do not wear jewelry, make-up or nail polish.  Do not wear lotions, powders, or perfumes. Do not wear deodorant.  Do not shave 48 hours prior to surgery.  Do not bring valuables to the hospital.  [facility] is not   responsible for any belongings or valuables brought to the hospital.  Contacts, dentures or bridgework may not be worn into surgery.  Leave suitcase in the car. After surgery it may be brought to your room.  For patients admitted to the hospital, checkout time is 11:00 AM the day of              discharge.      Please read over the following fact sheets that you were given:     Preparing for Surgery

## 2021-10-19 LAB — RPR: RPR Ser Ql: NONREACTIVE

## 2021-10-20 NOTE — Anesthesia Preprocedure Evaluation (Addendum)
Anesthesia Evaluation  Patient identified by MRN, date of birth, ID band Patient awake    Reviewed: Allergy & Precautions, NPO status , Patient's Chart, lab work & pertinent test results  Airway Mallampati: III  TM Distance: >3 FB Neck ROM: Full    Dental  (+) Teeth Intact, Dental Advisory Given   Pulmonary former smoker,    Pulmonary exam normal breath sounds clear to auscultation       Cardiovascular negative cardio ROS Normal cardiovascular exam Rhythm:Regular Rate:Normal     Neuro/Psych PSYCHIATRIC DISORDERS Anxiety Depression negative neurological ROS     GI/Hepatic negative GI ROS, Neg liver ROS,   Endo/Other  Hypothyroidism   Renal/GU Renal InsufficiencyRenal disease     Musculoskeletal negative musculoskeletal ROS (+)   Abdominal   Peds  Hematology negative hematology ROS (+) Plt 232k   Anesthesia Other Findings   Reproductive/Obstetrics (+) Pregnancy Previous myomectomy                             Anesthesia Physical Anesthesia Plan  ASA: 2  Anesthesia Plan: Spinal   Post-op Pain Management: Tylenol PO (pre-op)   Induction: Intravenous  PONV Risk Score and Plan: 1 and Treatment may vary due to age or medical condition, Scopolamine patch - Pre-op, Dexamethasone and Ondansetron  Airway Management Planned: Natural Airway  Additional Equipment:   Intra-op Plan:   Post-operative Plan:   Informed Consent: I have reviewed the patients History and Physical, chart, labs and discussed the procedure including the risks, benefits and alternatives for the proposed anesthesia with the patient or authorized representative who has indicated his/her understanding and acceptance.     Dental advisory given  Plan Discussed with: CRNA  Anesthesia Plan Comments:        Anesthesia Quick Evaluation

## 2021-10-21 ENCOUNTER — Encounter (HOSPITAL_COMMUNITY)
Admission: RE | Disposition: A | Discharge status: Home / Self Care | Payer: Self-pay | Source: Home / Self Care | Attending: Obstetrics

## 2021-10-21 ENCOUNTER — Inpatient Hospital Stay (HOSPITAL_COMMUNITY)
Admission: RE | Admit: 2021-10-21 | Discharge: 2021-10-24 | DRG: 787 | Disposition: A | Discharge status: Home / Self Care | Payer: BC Managed Care – PPO | Attending: Obstetrics | Admitting: Obstetrics

## 2021-10-21 ENCOUNTER — Inpatient Hospital Stay (HOSPITAL_COMMUNITY): Payer: BC Managed Care – PPO | Admitting: Anesthesiology

## 2021-10-21 ENCOUNTER — Encounter (HOSPITAL_COMMUNITY): Payer: Self-pay | Admitting: Obstetrics

## 2021-10-21 DIAGNOSIS — O9081 Anemia of the puerperium: Secondary | ICD-10-CM | POA: Diagnosis not present

## 2021-10-21 DIAGNOSIS — Z1589 Genetic susceptibility to other disease: Secondary | ICD-10-CM

## 2021-10-21 DIAGNOSIS — O99334 Smoking (tobacco) complicating childbirth: Secondary | ICD-10-CM | POA: Diagnosis present

## 2021-10-21 DIAGNOSIS — D62 Acute posthemorrhagic anemia: Secondary | ICD-10-CM | POA: Diagnosis not present

## 2021-10-21 DIAGNOSIS — F17210 Nicotine dependence, cigarettes, uncomplicated: Secondary | ICD-10-CM | POA: Diagnosis present

## 2021-10-21 DIAGNOSIS — Z9889 Other specified postprocedural states: Secondary | ICD-10-CM

## 2021-10-21 DIAGNOSIS — Z3A38 38 weeks gestation of pregnancy: Secondary | ICD-10-CM | POA: Diagnosis not present

## 2021-10-21 DIAGNOSIS — Z98891 History of uterine scar from previous surgery: Secondary | ICD-10-CM

## 2021-10-21 DIAGNOSIS — F32A Depression, unspecified: Secondary | ICD-10-CM | POA: Diagnosis present

## 2021-10-21 DIAGNOSIS — E039 Hypothyroidism, unspecified: Secondary | ICD-10-CM | POA: Diagnosis present

## 2021-10-21 DIAGNOSIS — O3663X0 Maternal care for excessive fetal growth, third trimester, not applicable or unspecified: Secondary | ICD-10-CM | POA: Diagnosis present

## 2021-10-21 DIAGNOSIS — O3429 Maternal care due to uterine scar from other previous surgery: Principal | ICD-10-CM | POA: Diagnosis present

## 2021-10-21 DIAGNOSIS — O99284 Endocrine, nutritional and metabolic diseases complicating childbirth: Secondary | ICD-10-CM | POA: Diagnosis present

## 2021-10-21 DIAGNOSIS — F419 Anxiety disorder, unspecified: Secondary | ICD-10-CM | POA: Diagnosis present

## 2021-10-21 HISTORY — DX: Herpesviral infection, unspecified: B00.9

## 2021-10-21 HISTORY — DX: Benign neoplasm of connective and other soft tissue, unspecified: D21.9

## 2021-10-21 HISTORY — DX: Chronic kidney disease, unspecified: N18.9

## 2021-10-21 LAB — GLUCOSE, CAPILLARY: Glucose-Capillary: 84 mg/dL (ref 70–99)

## 2021-10-21 LAB — SARS CORONAVIRUS 2 (TAT 6-24 HRS): SARS Coronavirus 2: NEGATIVE

## 2021-10-21 SURGERY — Surgical Case
Anesthesia: Spinal

## 2021-10-21 MED ORDER — PHENYLEPHRINE HCL-NACL 20-0.9 MG/250ML-% IV SOLN
INTRAVENOUS | Status: AC
  Filled 2021-10-21: qty 250

## 2021-10-21 MED ORDER — DIPHENHYDRAMINE HCL 25 MG PO CAPS: 25.0000 mg | ORAL_CAPSULE | Freq: Four times a day (QID) | ORAL | Status: DC | PRN

## 2021-10-21 MED ORDER — MEPERIDINE HCL 25 MG/ML IJ SOLN: 6.2500 mg | INTRAMUSCULAR | Status: DC | PRN

## 2021-10-21 MED ORDER — MORPHINE SULFATE (PF) 0.5 MG/ML IJ SOLN
INTRAMUSCULAR | Status: AC
  Filled 2021-10-21: qty 10

## 2021-10-21 MED ORDER — FENTANYL CITRATE (PF) 100 MCG/2ML IJ SOLN: 25.0000 ug | INTRAMUSCULAR | Status: DC | PRN

## 2021-10-21 MED ORDER — LACTATED RINGERS IV SOLN: INTRAVENOUS | Status: DC

## 2021-10-21 MED ORDER — ONDANSETRON HCL 4 MG/2ML IJ SOLN: 4.0000 mg | Freq: Three times a day (TID) | INTRAMUSCULAR | Status: DC | PRN

## 2021-10-21 MED ORDER — POVIDONE-IODINE 10 % EX SWAB
2.0000 "application " | Freq: Once | CUTANEOUS | Status: AC
  Administered 2021-10-21: 2 via TOPICAL

## 2021-10-21 MED ORDER — ONDANSETRON HCL 4 MG/2ML IJ SOLN
INTRAMUSCULAR | Status: DC | PRN
  Administered 2021-10-21: 4 mg via INTRAVENOUS

## 2021-10-21 MED ORDER — ONDANSETRON HCL 4 MG/2ML IJ SOLN
INTRAMUSCULAR | Status: AC
  Filled 2021-10-21: qty 2

## 2021-10-21 MED ORDER — PRENATAL MULTIVITAMIN CH
1.0000 | ORAL_TABLET | Freq: Every day | ORAL | Status: DC
  Administered 2021-10-21 – 2021-10-23 (×3): 1 via ORAL
  Filled 2021-10-21 (×4): qty 1

## 2021-10-21 MED ORDER — DIPHENHYDRAMINE HCL 25 MG PO CAPS: 25.0000 mg | ORAL_CAPSULE | ORAL | Status: DC | PRN

## 2021-10-21 MED ORDER — FENTANYL CITRATE (PF) 100 MCG/2ML IJ SOLN
INTRAMUSCULAR | Status: AC
  Filled 2021-10-21: qty 2

## 2021-10-21 MED ORDER — CHLORHEXIDINE GLUCONATE 0.12 % MT SOLN
OROMUCOSAL | Status: AC
  Filled 2021-10-21: qty 15

## 2021-10-21 MED ORDER — MORPHINE SULFATE (PF) 0.5 MG/ML IJ SOLN
INTRAMUSCULAR | Status: DC | PRN
  Administered 2021-10-21: 150 ug via INTRATHECAL

## 2021-10-21 MED ORDER — KETOROLAC TROMETHAMINE 30 MG/ML IJ SOLN
INTRAMUSCULAR | Status: AC
  Filled 2021-10-21: qty 1

## 2021-10-21 MED ORDER — SIMETHICONE 80 MG PO CHEW: 80.0000 mg | CHEWABLE_TABLET | ORAL | Status: DC | PRN

## 2021-10-21 MED ORDER — KETOROLAC TROMETHAMINE 30 MG/ML IJ SOLN
30.0000 mg | Freq: Four times a day (QID) | INTRAMUSCULAR | Status: AC
  Administered 2021-10-21 (×2): 30 mg via INTRAVENOUS
  Filled 2021-10-21 (×2): qty 1

## 2021-10-21 MED ORDER — ACETAMINOPHEN 500 MG PO TABS
1000.0000 mg | ORAL_TABLET | Freq: Four times a day (QID) | ORAL | Status: DC
  Administered 2021-10-21 – 2021-10-24 (×12): 1000 mg via ORAL
  Filled 2021-10-21 (×13): qty 2

## 2021-10-21 MED ORDER — MENTHOL 3 MG MT LOZG: 1.0000 | LOZENGE | OROMUCOSAL | Status: DC | PRN

## 2021-10-21 MED ORDER — SCOPOLAMINE 1 MG/3DAYS TD PT72
1.0000 | MEDICATED_PATCH | Freq: Once | TRANSDERMAL | Status: DC
  Administered 2021-10-21: 1.5 mg via TRANSDERMAL

## 2021-10-21 MED ORDER — ASPIRIN EC 81 MG PO TBEC
81.0000 mg | DELAYED_RELEASE_TABLET | Freq: Every day | ORAL | Status: DC
  Administered 2021-10-23 – 2021-10-24 (×2): 81 mg via ORAL
  Filled 2021-10-21 (×3): qty 1

## 2021-10-21 MED ORDER — CEFAZOLIN SODIUM-DEXTROSE 2-4 GM/100ML-% IV SOLN
INTRAVENOUS | Status: AC
  Filled 2021-10-21: qty 100

## 2021-10-21 MED ORDER — OXYTOCIN-SODIUM CHLORIDE 30-0.9 UT/500ML-% IV SOLN
INTRAVENOUS | Status: DC | PRN
  Administered 2021-10-21: 400 mL via INTRAVENOUS

## 2021-10-21 MED ORDER — DIPHENHYDRAMINE HCL 50 MG/ML IJ SOLN: 12.5000 mg | INTRAMUSCULAR | Status: DC | PRN

## 2021-10-21 MED ORDER — CEFAZOLIN SODIUM-DEXTROSE 2-4 GM/100ML-% IV SOLN
2.0000 g | INTRAVENOUS | Status: AC
  Administered 2021-10-21: 2 g via INTRAVENOUS

## 2021-10-21 MED ORDER — ORAL CARE MOUTH RINSE: 15.0000 mL | Freq: Once | OROMUCOSAL | Status: AC

## 2021-10-21 MED ORDER — BUPIVACAINE IN DEXTROSE 0.75-8.25 % IT SOLN
INTRATHECAL | Status: DC | PRN
  Administered 2021-10-21: 1.6 mL via INTRATHECAL

## 2021-10-21 MED ORDER — OXYCODONE HCL 5 MG PO TABS
5.0000 mg | ORAL_TABLET | ORAL | Status: DC | PRN
  Filled 2021-10-21: qty 1

## 2021-10-21 MED ORDER — ACETAMINOPHEN 500 MG PO TABS
ORAL_TABLET | ORAL | Status: AC
  Filled 2021-10-21: qty 2

## 2021-10-21 MED ORDER — FENTANYL CITRATE (PF) 100 MCG/2ML IJ SOLN
INTRAMUSCULAR | Status: DC | PRN
  Administered 2021-10-21: 15 ug via INTRATHECAL

## 2021-10-21 MED ORDER — TETANUS-DIPHTH-ACELL PERTUSSIS 5-2.5-18.5 LF-MCG/0.5 IM SUSY: 0.5000 mL | PREFILLED_SYRINGE | Freq: Once | INTRAMUSCULAR | Status: DC

## 2021-10-21 MED ORDER — SOD CITRATE-CITRIC ACID 500-334 MG/5ML PO SOLN
30.0000 mL | Freq: Once | ORAL | Status: AC
  Administered 2021-10-21: 30 mL via ORAL

## 2021-10-21 MED ORDER — SOD CITRATE-CITRIC ACID 500-334 MG/5ML PO SOLN
ORAL | Status: AC
  Filled 2021-10-21: qty 30

## 2021-10-21 MED ORDER — CHLORHEXIDINE GLUCONATE 0.12 % MT SOLN
15.0000 mL | Freq: Once | OROMUCOSAL | Status: AC
  Administered 2021-10-21: 15 mL via OROMUCOSAL

## 2021-10-21 MED ORDER — SCOPOLAMINE 1 MG/3DAYS TD PT72
MEDICATED_PATCH | TRANSDERMAL | Status: AC
  Filled 2021-10-21: qty 1

## 2021-10-21 MED ORDER — ACETAMINOPHEN 500 MG PO TABS: 1000.0000 mg | ORAL_TABLET | Freq: Four times a day (QID) | ORAL | Status: DC

## 2021-10-21 MED ORDER — SODIUM CHLORIDE 0.9% FLUSH: 3.0000 mL | INTRAVENOUS | Status: DC | PRN

## 2021-10-21 MED ORDER — SIMETHICONE 80 MG PO CHEW
80.0000 mg | CHEWABLE_TABLET | Freq: Three times a day (TID) | ORAL | Status: DC
  Administered 2021-10-21 – 2021-10-24 (×9): 80 mg via ORAL
  Filled 2021-10-21 (×9): qty 1

## 2021-10-21 MED ORDER — NALOXONE HCL 4 MG/10ML IJ SOLN
1.0000 ug/kg/h | INTRAVENOUS | Status: DC | PRN
  Filled 2021-10-21: qty 5

## 2021-10-21 MED ORDER — DIBUCAINE (PERIANAL) 1 % EX OINT: 1.0000 "application " | TOPICAL_OINTMENT | CUTANEOUS | Status: DC | PRN

## 2021-10-21 MED ORDER — PROMETHAZINE HCL 25 MG/ML IJ SOLN: 6.2500 mg | INTRAMUSCULAR | Status: DC | PRN

## 2021-10-21 MED ORDER — SENNOSIDES-DOCUSATE SODIUM 8.6-50 MG PO TABS
2.0000 | ORAL_TABLET | ORAL | Status: DC
  Administered 2021-10-21 – 2021-10-24 (×4): 2 via ORAL
  Filled 2021-10-21 (×4): qty 2

## 2021-10-21 MED ORDER — KETOROLAC TROMETHAMINE 30 MG/ML IJ SOLN
30.0000 mg | Freq: Four times a day (QID) | INTRAMUSCULAR | Status: AC | PRN
  Administered 2021-10-21: 30 mg via INTRAVENOUS

## 2021-10-21 MED ORDER — PHENYLEPHRINE HCL-NACL 20-0.9 MG/250ML-% IV SOLN
INTRAVENOUS | Status: DC | PRN
Start: 2021-10-21 — End: 2021-10-21
  Administered 2021-10-21: 60 ug/min via INTRAVENOUS

## 2021-10-21 MED ORDER — SCOPOLAMINE 1 MG/3DAYS TD PT72: 1.0000 | MEDICATED_PATCH | Freq: Once | TRANSDERMAL | Status: DC

## 2021-10-21 MED ORDER — FAMOTIDINE 20 MG PO TABS
ORAL_TABLET | ORAL | Status: AC
  Filled 2021-10-21: qty 1

## 2021-10-21 MED ORDER — ACETAMINOPHEN 500 MG PO TABS
1000.0000 mg | ORAL_TABLET | Freq: Once | ORAL | Status: AC
  Administered 2021-10-21: 1000 mg via ORAL

## 2021-10-21 MED ORDER — ZOLPIDEM TARTRATE 5 MG PO TABS: 5.0000 mg | ORAL_TABLET | Freq: Every evening | ORAL | Status: DC | PRN

## 2021-10-21 MED ORDER — OXYTOCIN-SODIUM CHLORIDE 30-0.9 UT/500ML-% IV SOLN: 2.5000 [IU]/h | INTRAVENOUS | Status: AC

## 2021-10-21 MED ORDER — IBUPROFEN 600 MG PO TABS
600.0000 mg | ORAL_TABLET | Freq: Four times a day (QID) | ORAL | Status: DC
  Administered 2021-10-22 – 2021-10-23 (×6): 600 mg via ORAL
  Filled 2021-10-21 (×6): qty 1

## 2021-10-21 MED ORDER — COCONUT OIL OIL: 1.0000 "application " | TOPICAL_OIL | Status: DC | PRN

## 2021-10-21 MED ORDER — KETOROLAC TROMETHAMINE 30 MG/ML IJ SOLN: 30.0000 mg | Freq: Four times a day (QID) | INTRAMUSCULAR | Status: AC | PRN

## 2021-10-21 MED ORDER — WITCH HAZEL-GLYCERIN EX PADS: 1.0000 "application " | MEDICATED_PAD | CUTANEOUS | Status: DC | PRN

## 2021-10-21 MED ORDER — FAMOTIDINE 20 MG PO TABS
20.0000 mg | ORAL_TABLET | Freq: Once | ORAL | Status: AC
  Administered 2021-10-21: 20 mg via ORAL

## 2021-10-21 MED ORDER — NALOXONE HCL 0.4 MG/ML IJ SOLN: 0.4000 mg | INTRAMUSCULAR | Status: DC | PRN

## 2021-10-21 MED ORDER — LEVOTHYROXINE SODIUM 50 MCG PO TABS
50.0000 ug | ORAL_TABLET | Freq: Every day | ORAL | Status: DC
  Administered 2021-10-22 – 2021-10-24 (×3): 50 ug via ORAL
  Filled 2021-10-21 (×3): qty 1

## 2021-10-21 SURGICAL SUPPLY — 38 items
APL SKNCLS STERI-STRIP NONHPOA (GAUZE/BANDAGES/DRESSINGS) ×1
BENZOIN TINCTURE PRP APPL 2/3 (GAUZE/BANDAGES/DRESSINGS) ×2 IMPLANT
CHLORAPREP W/TINT 26ML (MISCELLANEOUS) ×4 IMPLANT
CLAMP CORD UMBIL (MISCELLANEOUS) IMPLANT
CLOSURE STERI STRIP 1/2 X4 (GAUZE/BANDAGES/DRESSINGS) ×2 IMPLANT
CLOSURE WOUND 1/2 X4 (GAUZE/BANDAGES/DRESSINGS)
CLOTH BEACON ORANGE TIMEOUT ST (SAFETY) ×4 IMPLANT
DRSG OPSITE POSTOP 4X10 (GAUZE/BANDAGES/DRESSINGS) ×4 IMPLANT
ELECT REM PT RETURN 9FT ADLT (ELECTROSURGICAL) ×3
ELECTRODE REM PT RTRN 9FT ADLT (ELECTROSURGICAL) ×2 IMPLANT
EXTRACTOR VACUUM M CUP 4 TUBE (SUCTIONS) IMPLANT
EXTRACTOR VACUUM M CUP 4' TUBE (SUCTIONS)
GLOVE BIO SURGEON STRL SZ 6.5 (GLOVE) ×3 IMPLANT
GLOVE BIO SURGEONS STRL SZ 6.5 (GLOVE) ×1
GLOVE BIOGEL PI IND STRL 7.0 (GLOVE) ×4 IMPLANT
GLOVE BIOGEL PI INDICATOR 7.0 (GLOVE) ×4
GOWN STRL REUS W/TWL LRG LVL3 (GOWN DISPOSABLE) ×8 IMPLANT
KIT ABG SYR 3ML LUER SLIP (SYRINGE) IMPLANT
NDL HYPO 25X5/8 SAFETYGLIDE (NEEDLE) IMPLANT
NEEDLE HYPO 22GX1.5 SAFETY (NEEDLE) IMPLANT
NEEDLE HYPO 25X5/8 SAFETYGLIDE (NEEDLE) IMPLANT
NS IRRIG 1000ML POUR BTL (IV SOLUTION) ×4 IMPLANT
PACK C SECTION WH (CUSTOM PROCEDURE TRAY) ×4 IMPLANT
PAD OB MATERNITY 4.3X12.25 (PERSONAL CARE ITEMS) ×4 IMPLANT
PENCIL SMOKE EVAC W/HOLSTER (ELECTROSURGICAL) ×4 IMPLANT
STRIP CLOSURE SKIN 1/2X4 (GAUZE/BANDAGES/DRESSINGS) IMPLANT
SUT MON AB 4-0 PS1 27 (SUTURE) ×4 IMPLANT
SUT PLAIN 0 NONE (SUTURE) IMPLANT
SUT PLAIN 2 0 XLH (SUTURE) ×2 IMPLANT
SUT VIC AB 0 CT1 36 (SUTURE) ×8 IMPLANT
SUT VIC AB 0 CTX 36 (SUTURE) ×9
SUT VIC AB 0 CTX36XBRD ANBCTRL (SUTURE) ×4 IMPLANT
SUT VIC AB 2-0 CT1 27 (SUTURE) ×3
SUT VIC AB 2-0 CT1 TAPERPNT 27 (SUTURE) ×2 IMPLANT
SYR CONTROL 10ML LL (SYRINGE) IMPLANT
TOWEL OR 17X24 6PK STRL BLUE (TOWEL DISPOSABLE) ×4 IMPLANT
TRAY FOLEY W/BAG SLVR 14FR LF (SET/KITS/TRAYS/PACK) IMPLANT
WATER STERILE IRR 1000ML POUR (IV SOLUTION) ×4 IMPLANT

## 2021-10-21 NOTE — Op Note (Signed)
10/21/2021  9:14 AM  PATIENT:  Ariel [name]  35 y.o. female  PRE-OPERATIVE DIAGNOSIS:  primary cesarean section; Previous Myomectomy  POST-OPERATIVE DIAGNOSIS:  primary cesarean section; Previous Myomectomy  PROCEDURE:  Procedure(s) with comments: Primary CESAREAN SECTION (N/A) - EDD: 11/02/21 Low-transverse cesarean section with 2 layer closure  SURGEON:  Surgeon(s) and Role:    * Aloha Gell, MD - Primary  PHYSICIAN ASSISTANT:   ASSISTANTS: Derrell Lolling, CNM  ANESTHESIA:   spinal  EBL:  925 mL   BLOOD ADMINISTERED:none  DRAINS: Urinary Catheter (Foley)   LOCAL MEDICATIONS USED:  NONE  SPECIMEN: Placenta  DISPOSITION OF SPECIMEN:  Disposal  COUNTS:  YES  TOURNIQUET:  * No tourniquets in log *  DICTATION: .Note written in [place]: Admit to inpatient   PATIENT DISPOSITION:  PACU - hemodynamically stable.   Delay start of Pharmacological VTE agent (>24hrs) due to surgical blood loss or risk of bleeding: yes   Findings:  @BABYSEXEBC @ infant,  APGAR (1 MIN):   APGAR (5 MINS):   APGAR (10 MINS):   Normal uterus, tubes and ovaries, normal placenta. 3VC, clear amniotic fluid  EBL: Per nursing notes cc Antibiotics:   2g Ancef Complications: none  Indications: This is a 35 y.o. year-old, G3P0  At [redacted]w[redacted]d admitted for primary cesarean section. Risks benefits and alternatives of the procedure were discussed with the patient who agreed to proceed  Procedure:  After informed consent was obtained the patient was taken to the operating room where spinal anesthesia was initiated.  She was prepped and draped in the normal sterile fashion in dorsal supine position with a leftward tilt.  A foley catheter was in place.  A Pfannenstiel skin incision was made 2 cm above the pubic symphysis in the midline with the scalpel.  Dissection was carried down with the Bovie cautery until the fascia was reached. The fascia was incised in the midline. The incision was  extended laterally with the Mayo scissors. The inferior aspect of the fascial incision was grasped with the Coker clamps, elevated up and the underlying rectus muscles were dissected off sharply. The superior aspect of the fascial incision was grasped with the Coker clamps elevated up and the underlying rectus muscles were dissected off sharply.  The peritoneum was . The peritoneal incision was extended superiorly and inferiorly with good visualization of the bladder. The bladder blade was inserted and palpation was done to assess the fetal position and the location of the uterine vessels. The lower segment of the uterus was incised sharply with the scalpel and extended  bluntly in the cephalo-caudal fashion. The infant was grasped, brought to the incision,  rotated and the infant was delivered with fundal pressure. The nose and mouth were bulb suctioned. The cord was clamped and cut after 1 minute delay. The infant was handed off to the waiting pediatrician. The placenta was expressed. The uterus was exteriorized. The uterus was cleared of all clots and debris. The uterine incision was repaired with 0 Vicryl in a running locked fashion.  A second layer of the same suture was used in an imbricating fashion to obtain excellent hemostasis.  Several additional sutures were placed in the left angle.  the uterus was then returned to the abdomen, the gutters were cleared of all clots and debris. The uterine incision was reinspected and found to be hemostatic. The peritoneum was grasped and closed with 2-0 Vicryl in a running fashion. The cut muscle edges and the underside of  the fascia were inspected and found to be hemostatic. The fascia was closed with 0 Vicryl in a single layer . The subcutaneous tissue was irrigated. Scarpa's layer was closed with a 2-0 plain gut suture. The skin was closed with a 4-0 Monocryl in a single layer. The patient tolerated the procedure well. Sponge lap and needle counts were correct x3 and  patient was taken to the recovery room in a stable condition.  Ala Dach 10/21/2021 9:15 AM

## 2021-10-21 NOTE — Lactation Note (Signed)
This note was copied from a baby's chart. Lactation Consultation Note  Patient Name: Boy Annaliyah Willig [id number] Date: 10/21/2021 Reason for consult: Initial assessment;Primapara;1st time breastfeeding;Maternal endocrine disorder IVF pregnancy, hypothyroidism, no breast changes in early pregnancy Age:36 hours  LC in to visit with P1 Mom of ET ([redacted]w[redacted]d) infant.  Baby has been sleepy with 2 attempts to breastfeed noted.   FOB holding baby.  Subtle cues noted and offered to assist with latching.    Reviewed breast massage and hand expression.  Drops of colostrum noted.  Mom reports no breast changes with this pregnancy.  Breasts look normal symmetry and ductal glands palpated.    Baby placed STS in laid back prone position.  Baby licked colostrum off Mom's nipple, but became fussy and Mom wanted to stop.  Mom feeling lightheaded at times as she suffers from hypoglycemia.  Talked about adding double pumping to support her milk supply due to her risk factors.  Mom encouraged to focus on latching baby to the breast and to drop pumping once baby is consistently breastfeeding.  Assisted Mom to pump on initiation setting with 24 mm flanges.  Mom and FOB taught to disassemble pump parts, wash, rinse and air dry in separate bin provided.  Encouraged STS and offering the breast with cues. Mom denies any further questions.  LATCH Score Latch: Too sleepy or reluctant, no latch achieved, no sucking elicited.  Audible Swallowing: None  Type of Nipple: Everted at rest and after stimulation  Comfort (Breast/Nipple): Soft / non-tender  Hold (Positioning): Assistance needed to correctly position infant at breast and maintain latch.  LATCH Score: 5   Lactation Tools Discussed/Used Tools: Pump;Flanges Flange Size: 24 Breast pump type: Double-Electric Breast Pump Pump Education: Setup, frequency, and cleaning;Milk Storage Reason for Pumping: Support milk supply Pumping frequency: Mom to pump after  breastfeeding  Interventions Interventions: Breast feeding basics reviewed;Assisted with latch;Skin to skin;Breast massage;Hand express;Support pillows;DEBP;Education;LC Services brochure  Discharge Pump: Personal (Not sure the make, given to her by a friend) [facility] Program: No  Consult Status Consult Status: Follow-up Date: 10/22/21 Follow-up type: In-patient    [name] 10/21/2021, 1:41 PM

## 2021-10-21 NOTE — H&P (Signed)
Ariel [name] is a 35 y.o. G3P0020 at [redacted]w[redacted]d presenting for [facility] for h/o myomectomy. Pt notes no contractions. Good fetal movement, No vaginal bleeding, not leaking fluid.  [facility] at [facility] since 10 wks - IVF preg, PGD, SET, nl 12 wk CL - PAI-1, heparin til 12 wks, baby ASA through preg - LGA, growth at 34 wks, 96% with AC 99% -prior myomectomy, laprascopic, 11x 7 cm myoma removed - endometriosis -hypothyroid - h/o genital HSV   Prenatal Transfer Tool  Maternal Diabetes: No Genetic Screening: Normal Maternal Ultrasounds/Referrals: Normal Fetal Ultrasounds or other Referrals:  None Maternal Substance Abuse:  No Significant Maternal Medications:  None Significant Maternal Lab Results: Group B Strep negative     OB History     Gravida  3   Para      Term      Preterm      AB  2   Living         SAB  2   IAB      Ectopic      Multiple      Live Births             Past Medical History:  Diagnosis Date   Anxiety    Chronic kidney disease    Depression    Fibroid    11x7 fibroid removed by Dr. Darreld Mclean   History of kidney stones    HSV (herpes simplex virus) infection    Hyperlipidemia    Hypoglycemia    Hypothyroidism    Newborn product of IVF pregnancy    Past Surgical History:  Procedure Laterality Date   DILATION AND EVACUATION  03/2020   preformed in Dr office   EXTRACORPOREAL SHOCK WAVE LITHOTRIPSY Right 10/11/2018   Procedure: EXTRACORPOREAL SHOCK WAVE LITHOTRIPSY (ESWL);  Surgeon: Ardis Hughs, MD;  Location: [facility];  Service: Urology;  Laterality: Right;   GANGLION CYST EXCISION Right 04/21/2017   Procedure: OPEN EXCISION DORSAL ULNAR GANGLION RIGHT WRIST;  Surgeon: Daryll Brod, MD;  Location: [place];  Service: Orthopedics;  Laterality: Right;   HYSTEROSCOPY N/A 10/10/2020   Procedure: HYSTEROSCOPY, POLYPECTOMY;  Surgeon: Governor Specking, MD;  Location: [facility];  Service: Gynecology;   Laterality: N/A;   LAPAROSCOPIC GELPORT ASSISTED MYOMECTOMY N/A 10/10/2020   Procedure: LAPAROSCOPIC GELPORT ASSISTED MYOMECTOMY; ABLATION OF ENDOMETRIOSIS; CHROMOTUBATION;  Surgeon: Governor Specking, MD;  Location: [facility];  Service: Gynecology;  Laterality: N/A;   LEEP     REFRACTIVE SURGERY  2018   wisdom teeth extraction     WRIST ARTHROSCOPY WITH DEBRIDEMENT Right 04/21/2017   Procedure: ARTHROSCOPY RIGHT WRIST with debridement;  Surgeon: Daryll Brod, MD;  Location: [place];  Service: Orthopedics;  Laterality: Right;   Family History: family history includes Cancer in her father and maternal grandfather; Heart attack in her paternal grandfather; Heart failure in her father; Stroke in her maternal grandmother. Social History:  reports that she has been smoking cigarettes. She has a 4.50 pack-year smoking history. She has never used smokeless tobacco. She reports current alcohol use. She reports that she does not currently use drugs after having used the following drugs: Marijuana.  Review of Systems - Negative except anxiety over surgery     Blood pressure 120/86, pulse 95, temperature 98.7 F (37.1 C), temperature source Oral, resp. rate 20, height 5\' 2"  (1.575 m), weight 80 kg, SpO2 100 %.  Physical Exam:  Gen: well appearing, no distress  Abd: gravid, NT, no RUQ pain LE: no edema, equal bilaterally, non-tender   Prenatal labs: ABO, Rh: --/--/O POS (12/09 1334) Antibody: NEG (12/09 1334) Rubella: Immune (05/31 0000) RPR: NON REACTIVE (12/09 1334)  HBsAg: Negative (05/31 0000)  HIV: Non-reactive (05/31 0000)  GBS: Negative/-- (11/21 0000)  1 hr Glucola 107  Genetic screening nl PGD Anatomy US normal   Assessment/Plan: 35 y.o. G3P0020 at [redacted]w[redacted]d Prior myomectomy, for PCS Hypothyroid, cont meds R/b surgery d/w pt, consents to blood if needed   [employer] 10/21/2021, 7:25 AM

## 2021-10-21 NOTE — Brief Op Note (Signed)
10/21/2021  9:14 AM  PATIENT:  Hali Marry  35 y.o. female  PRE-OPERATIVE DIAGNOSIS:  primary cesarean section; Previous Myomectomy  POST-OPERATIVE DIAGNOSIS:  primary cesarean section; Previous Myomectomy  PROCEDURE:  Procedure(s) with comments: Primary CESAREAN SECTION (N/A) - EDD: 11/02/21 Low-transverse cesarean section with 2 layer closure  SURGEON:  Surgeon(s) and Role:    * Aloha Gell, MD - Primary  PHYSICIAN ASSISTANT:   ASSISTANTS: Derrell Lolling, CNM  ANESTHESIA:   spinal  EBL:  925 mL   BLOOD ADMINISTERED:none  DRAINS: Urinary Catheter (Foley)   LOCAL MEDICATIONS USED:  NONE  SPECIMEN: Placenta  DISPOSITION OF SPECIMEN:   Disposal  COUNTS:  YES  TOURNIQUET:  * No tourniquets in log *  DICTATION: .Note written in [place]: Admit to inpatient   PATIENT DISPOSITION:  PACU - hemodynamically stable.   Delay start of Pharmacological VTE agent (>24hrs) due to surgical blood loss or risk of bleeding: yes

## 2021-10-21 NOTE — Anesthesia Postprocedure Evaluation (Signed)
Anesthesia Post Note  Patient: ETOILE LOOMAN  Procedure(s) Performed: Primary CESAREAN SECTION     Patient location during evaluation: PACU Anesthesia Type: Spinal Level of consciousness: oriented, awake and alert and awake Pain management: pain level controlled Vital Signs Assessment: post-procedure vital signs reviewed and stable Respiratory status: spontaneous breathing, respiratory function stable and nonlabored ventilation Cardiovascular status: blood pressure returned to baseline and stable Postop Assessment: no headache, no backache, no apparent nausea or vomiting, patient able to bend at knees and spinal receding Anesthetic complications: no   No notable events documented.  Last Vitals:  Vitals:   10/21/21 1237 10/21/21 1300  BP: (!) 90/55 94/60  Pulse: (!) 55 (!) 57  Resp: 16 18  Temp: (!) 36.3 C (!) 36.3 C  SpO2: 97% 97%    Last Pain:  Vitals:   10/21/21 1300  TempSrc: Oral  PainSc:    Pain Goal:                   Catalina Gravel

## 2021-10-21 NOTE — Anesthesia Procedure Notes (Signed)
Spinal  Patient location during procedure: OR Start time: 10/21/2021 7:34 AM End time: 10/21/2021 7:37 AM Reason for block: surgical anesthesia Staffing Performed: anesthesiologist  Anesthesiologist: Catalina Gravel, MD Preanesthetic Checklist Completed: patient identified, IV checked, risks and benefits discussed, surgical consent, monitors and equipment checked, pre-op evaluation and timeout performed Spinal Block Patient position: sitting Prep: DuraPrep and site prepped and draped Patient monitoring: continuous pulse ox and blood pressure Approach: midline Location: L3-4 Injection technique: single-shot Needle Needle type: Pencan  Needle gauge: 24 G Assessment Sensory level: T6 Events: CSF return Additional Notes Functioning IV was confirmed and monitors were applied. Sterile prep and drape, including hand hygiene, mask and sterile gloves were used. The patient was positioned and the spine was prepped. The skin was anesthetized with lidocaine.  Free flow of clear CSF was obtained prior to injecting local anesthetic into the CSF.  The spinal needle aspirated freely following injection.  The needle was carefully withdrawn.  The patient tolerated the procedure well. Consent was obtained prior to procedure with all questions answered and concerns addressed. Risks including but not limited to bleeding, infection, nerve damage, paralysis, failed block, inadequate analgesia, allergic reaction, high spinal, itching and headache were discussed and the patient wished to proceed.   Hoy Morn, MD

## 2021-10-21 NOTE — Lactation Note (Signed)
This note was copied from a baby's chart. Lactation Consultation Note  Patient Name: Ariel [name] VFMBB'U Date: 10/21/2021 Reason for consult: Follow-up assessment;Mother's request;Difficult latch;Term;RN request;Breastfeeding assistance Age:36 hours LC assisted with latching infant at the breast with increase depth of swallows with breast compression. Best latch for infant cross cradle prone to get more depth on breast.   Mom denied any pain with the latch. Infant still feeding at the end of the visit.   Maternal Data Has patient been taught Hand Expression?: Yes  Feeding Mother's Current Feeding Choice: Breast Milk  LATCH Score Latch: Repeated attempts needed to sustain latch, nipple held in mouth throughout feeding, stimulation needed to elicit sucking reflex.  Audible Swallowing: A few with stimulation  Type of Nipple: Everted at rest and after stimulation  Comfort (Breast/Nipple): Soft / non-tender  Hold (Positioning): Assistance needed to correctly position infant at breast and maintain latch.  LATCH Score: 7   Lactation Tools Discussed/Used    Interventions Interventions: Breast feeding basics reviewed;Assisted with latch;Skin to skin;Breast massage;Breast compression;Support pillows;Adjust position;Position options;Expressed milk;Education  Discharge    Consult Status Consult Status: Follow-up Date: 10/22/21 Follow-up type: In-patient    Acen Craun  Nicholson-Springer 10/21/2021, 8:06 PM

## 2021-10-21 NOTE — Transfer of Care (Signed)
Immediate Anesthesia Transfer of Care Note  Patient: Ariel [name]  Procedure(s) Performed: Primary CESAREAN SECTION  Patient Location: PACU  Anesthesia Type:Spinal  Level of Consciousness: awake  Airway & Oxygen Therapy: Patient Spontanous Breathing  Post-op Assessment: Report given to RN and Post -op Vital signs reviewed and stable  Post vital signs: Reviewed and stable  Last Vitals:  Vitals Value Taken Time  BP 94/70 10/21/21 0903  Temp    Pulse 80 10/21/21 0905  Resp 17 10/21/21 0905  SpO2 99 % 10/21/21 0905  Vitals shown include unvalidated device data.  Last Pain:  Vitals:   10/21/21 0554  TempSrc: Oral         Complications: No notable events documented.

## 2021-10-22 ENCOUNTER — Encounter (HOSPITAL_COMMUNITY): Payer: Self-pay | Admitting: Obstetrics

## 2021-10-22 ENCOUNTER — Other Ambulatory Visit: Payer: Self-pay

## 2021-10-22 DIAGNOSIS — E039 Hypothyroidism, unspecified: Secondary | ICD-10-CM | POA: Diagnosis present

## 2021-10-22 DIAGNOSIS — F32A Depression, unspecified: Secondary | ICD-10-CM | POA: Diagnosis present

## 2021-10-22 DIAGNOSIS — Z1589 Genetic susceptibility to other disease: Secondary | ICD-10-CM

## 2021-10-22 DIAGNOSIS — F419 Anxiety disorder, unspecified: Secondary | ICD-10-CM | POA: Diagnosis present

## 2021-10-22 DIAGNOSIS — D62 Acute posthemorrhagic anemia: Secondary | ICD-10-CM | POA: Diagnosis not present

## 2021-10-22 LAB — CBC
HCT: 29.3 % — ABNORMAL LOW (ref 36.0–46.0)
Hemoglobin: 9.7 g/dL — ABNORMAL LOW (ref 12.0–15.0)
MCH: 29 pg (ref 26.0–34.0)
MCHC: 33.1 g/dL (ref 30.0–36.0)
MCV: 87.5 fL (ref 80.0–100.0)
Platelets: 175 10*3/uL (ref 150–400)
RBC: 3.35 MIL/uL — ABNORMAL LOW (ref 3.87–5.11)
RDW: 13.2 % (ref 11.5–15.5)
WBC: 14.6 10*3/uL — ABNORMAL HIGH (ref 4.0–10.5)
nRBC: 0 % (ref 0.0–0.2)

## 2021-10-22 MED ORDER — POLYSACCHARIDE IRON COMPLEX 150 MG PO CAPS
150.0000 mg | ORAL_CAPSULE | Freq: Every day | ORAL | Status: DC
  Administered 2021-10-22 – 2021-10-24 (×3): 150 mg via ORAL
  Filled 2021-10-22 (×3): qty 1

## 2021-10-22 MED ORDER — MAGNESIUM OXIDE -MG SUPPLEMENT 400 (240 MG) MG PO TABS
400.0000 mg | ORAL_TABLET | Freq: Every day | ORAL | Status: DC
  Administered 2021-10-22 – 2021-10-24 (×3): 400 mg via ORAL
  Filled 2021-10-22 (×3): qty 1

## 2021-10-22 NOTE — Progress Notes (Signed)
Subjective: POD# 1 Live born female  Birth Weight: 8 lb 2.5 oz (3700 g) APGAR: 7, 8  Newborn Delivery   Birth date/time: 10/21/2021 08:07:00 Delivery type: C-Section, Low Transverse Trial of labor: No C-section categorization: Primary     Baby name: Ariel [name] (pronounced "cool-en")  Delivering provider: Aloha Gell   Circumcision Yes, planning  Feeding: breast  Pain control at delivery: Spinal   Reports feeling well this morning with no complaints. Working on breastfeeding with LCs.   Patient reports tolerating PO.   Pain controlled with acetaminophen and prescription NSAID's including ketorolac (Toradol) Denies HA/SOB/C/P/N/V/dizziness. She reports vaginal bleeding as normal, without clots. She is ambulating and urinating without difficulty.     Objective:  Vitals:   10/21/21 1847 10/21/21 2230 10/22/21 0230 10/22/21 0554  BP:  100/66 108/65 109/67  Pulse:  65 64 77  Resp: 16 18 17 17   Temp: 98.2 F (36.8 C) 97.7 F (36.5 C) 98 F (36.7 C) 98.5 F (36.9 C)  TempSrc:  Oral Oral Oral  SpO2:  100% 99% 98%  Weight:      Height:        Intake/Output Summary (Last 24 hours) at 10/22/2021 1150 Last data filed at 10/22/2021 0345 Gross per 24 hour  Intake 600 ml  Output 2525 ml  Net -1925 ml      Recent Labs    10/22/21 0507  WBC 14.6*  HGB 9.7*  HCT 29.3*  PLT 175    Blood type: --/--/O POS (12/09 1334)  Rubella: Immune (05/31 0000)   Physical Exam:  General: alert and cooperative CV: Regular rate and rhythm Resp: clear Abdomen: soft, nontender, normal bowel sounds Incision: clean, dry, and intact Uterine Fundus: firm, below umbilicus, nontender Lochia: minimal Ext: extremities normal, atraumatic, no cyanosis or edema  Assessment/Plan: 35 y.o.   POD# 1. [id number]                  Principal Problem:   Postpartum care following cesarean delivery 12/12  Encourage rest when baby rests Breastfeeding support Encourage to ambulate Routine post-op  care Active Problems:   History of myomectomy   S/P cesarean section   PAI-1    Hypothyroidism  Continue Synthroid 53mcg   Anxiety and depression  Stable off meds  Close PP F/U for mood   Anemia associated with acute blood loss EBL 925cc  Asymptomatic  Start PO Niferex and mag oxide today  Anticipate discharge tomorrow.            Suzan Nailer, CNM, MSN 10/22/2021, 11:50 AM

## 2021-10-22 NOTE — Lactation Note (Signed)
This note was copied from a baby's chart. Lactation Consultation Note  Patient Name: Ariel [name] FUXNA'T Date: 10/22/2021 Reason for consult: Follow-up assessment;Mother's request;Difficult latch;Term;Maternal endocrine disorder Age:36 hours  LC assisted with latching infant in cross cradle prone with audible swallows heard with breast compression.  Mom encouraged to use pump regular schedule post pumping after latching DEBP q 3 hrs for 15 min. Mom to offer any ebm via spoon if small volume and if larger try curve tip syringe and finger feeding.  Currently mom getting 1-2 ml with pumping.   Mom offered DBM as an option to give more volume. Mom would like to continue working on her latching and pumping for now.  All questions answered at the end of the visit.  RN, Darleene Cleaver Effered informed of the results of visit as stated above.   Maternal Data Has patient been taught Hand Expression?: Yes  Feeding Mother's Current Feeding Choice: Breast Milk  LATCH Score Latch: Repeated attempts needed to sustain latch, nipple held in mouth throughout feeding, stimulation needed to elicit sucking reflex.  Audible Swallowing: Spontaneous and intermittent  Type of Nipple: Everted at rest and after stimulation  Comfort (Breast/Nipple): Soft / non-tender  Hold (Positioning): Assistance needed to correctly position infant at breast and maintain latch.  LATCH Score: 8   Lactation Tools Discussed/Used Tools: Pump;Flanges Flange Size: 24;27 Breast pump type: Double-Electric Breast Pump Pump Education: Setup, frequency, and cleaning;Milk Storage Reason for Pumping: increase stimulation Pumping frequency: every 3 hrs for 15 min  Interventions Interventions: Breast feeding basics reviewed;Adjust position;DEBP;Assisted with latch;Support pillows;Skin to skin;Position options;Education;Breast massage;Expressed milk;Hand express;Breast compression;Tour manager  education  Discharge    Consult Status Consult Status: Follow-up Date: 10/23/21 Follow-up type: In-patient    Kristoff Coonradt  Nicholson-Springer 10/22/2021, 4:29 PM

## 2021-10-22 NOTE — Social Work (Signed)
MOB was referred for history of depression/anxiety.  * Referral screened out by Clinical Social Worker because none of the following criteria appear to apply: ~ History of anxiety/depression during this pregnancy, or of post-partum depression following prior delivery. ~ Diagnosis of anxiety and/or depression within last 3 years. Per chart review, onset anxiety in 2014, and depression in 2018.  OR * MOB's symptoms currently being treated with medication and/or therapy. MOB takes Alprazolam prn for symptoms.  Please contact the Clinical Social Worker if needs arise, by [facility] request, or if MOB scores greater than 9/yes to question 10 on Edinburgh Postpartum Depression Screen.   Kathrin Greathouse, MSW, LCSW Women's and [place] Worker  [phone] 10/22/2021  8:44 AM

## 2021-10-23 MED ORDER — OXYCODONE HCL 5 MG PO TABS
5.0000 mg | ORAL_TABLET | ORAL | Status: DC | PRN
Start: 2021-10-23 — End: 2021-10-24
  Administered 2021-10-23 – 2021-10-24 (×3): 5 mg via ORAL
  Filled 2021-10-23 (×4): qty 1

## 2021-10-23 MED ORDER — IBUPROFEN 800 MG PO TABS
800.0000 mg | ORAL_TABLET | Freq: Three times a day (TID) | ORAL | Status: DC
  Administered 2021-10-23 – 2021-10-24 (×2): 800 mg via ORAL
  Filled 2021-10-23 (×2): qty 1

## 2021-10-23 MED ORDER — OXYCODONE HCL 5 MG PO TABS: 5.0000 mg | ORAL_TABLET | ORAL | Status: DC | PRN

## 2021-10-23 NOTE — Lactation Note (Signed)
This note was copied from a baby's chart. Lactation Consultation Note  Patient Name: Ariel [name] GNFAO'Z Date: 10/23/2021 Reason for consult: Follow-up assessment;1st time breastfeeding;Primapara;Nipple pain/trauma;Early term 37-38.6wks Age:36 hours  LC in to visit with P50 Mom of ET infant, baby at 6.6% weight loss.  2 stools and 3 voids last 24 hrs.  Mom reports baby is feeding much better today, even fed 20 mins after his circumcision.  Mom does report nipple soreness.  No visible nipple trauma noted.  Mom given coconut oil.  LC recommended colostrum expressed on nipple for soreness.  RN in to assess circumcision.  Baby awakened and LC offered to assist/assess baby's latch to the breast.    Baby placed STS across Mom's chest.  Mom trying to hand express colostrum, reviewed how to press back before compressing forward.    Baby too sleepy to latch, no rooting noted.  Reviewed use of cross cradle hold with a U hold of breast and support of baby's head. Mom reports nipples are sore, but feeding isn't painful throughout.  Encouraged Mom to call for help prn.   LATCH Score Latch: Too sleepy or reluctant, no latch achieved, no sucking elicited.  Audible Swallowing: None  Type of Nipple: Everted at rest and after stimulation  Comfort (Breast/Nipple): Filling, red/small blisters or bruises, mild/mod discomfort (no visible trauma)  Hold (Positioning): Assistance needed to correctly position infant at breast and maintain latch.  LATCH Score: 4   Lactation Tools Discussed/Used Tools: Pump;Flanges;Coconut oil Flange Size: 24 Breast pump type: Double-Electric Breast Pump Pumping frequency: occasionally  Interventions Interventions: Breast feeding basics reviewed;Assisted with latch;Skin to skin;Breast massage;Hand express;Support pillows;DEBP;Coconut oil;Expressed milk  Discharge Pump: Manual  Mom states she is borrowing a pump from a friend.  Doesn't know the  brand.  Consult Status Consult Status: Follow-up Date: 10/24/21 Follow-up type: In-patient    [name] 10/23/2021, 6:13 PM

## 2021-10-23 NOTE — Progress Notes (Signed)
Subjective: POD# 2 Live born female  Birth Weight: 8 lb 2.5 oz (3700 g) APGAR: 7, 8  Newborn Delivery   Birth date/time: 10/21/2021 08:07:00 Delivery type: C-Section, Low Transverse Trial of labor: No C-section categorization: Primary     Baby name: Cuillin (pronounced "cool-en")  Delivering provider: Aloha Gell   Circumcision Yes, planning  Feeding: breast  Pain control at delivery: Spinal   Reports feeling more pain last night and this morning. Hesitant to take narcotics. Lots of questions regarding medications.   Patient reports tolerating PO.   Pain controlled with acetaminophen and ibuprofen Denies HA/SOB/C/P/N/V/dizziness. She reports vaginal bleeding as normal, without clots. She is ambulating and urinating without difficulty.     Objective:  Vitals:   10/22/21 2313 10/23/21 0534 10/23/21 0900 10/23/21 1425  BP: 108/64 120/86 107/73 120/71  Pulse: 85 86 78 84  Resp: 17 17 16 16   Temp: 98 F (36.7 C) 98.2 F (36.8 C)  98.5 F (36.9 C)  TempSrc: Oral Oral  Oral  SpO2: 99% 99%    Weight:      Height:       No intake or output data in the 24 hours ending 10/23/21 1801     Recent Labs    10/22/21 0507  WBC 14.6*  HGB 9.7*  HCT 29.3*  PLT 175     Blood type: --/--/O POS (12/09 1334)  Rubella: Immune (05/31 0000)   Physical Exam:  General: alert and cooperative CV: Regular rate and rhythm Resp: clear Abdomen: soft, nontender, normal bowel sounds Incision: clean, dry, and intact Uterine Fundus: firm, below umbilicus, nontender Lochia: minimal Ext: extremities normal, atraumatic, no cyanosis or edema  Assessment/Plan: 35 y.o.   POD# 2. [id number]                  Principal Problem:   Postpartum care following cesarean delivery 12/12  Encourage rest when baby rests Breastfeeding support Encourage to ambulate Routine post-op care Active Problems:   History of myomectomy   S/P cesarean section   PAI-1    Hypothyroidism  Continue Synthroid  70mcg   Anxiety and depression  Stable off meds  Close PP F/U for mood   Anemia associated with acute blood loss EBL 925cc  Asymptomatic  Continue PO Niferex and mag oxide  Declines discharge today. Would like to stay for pain management and breastfeeding support. Anticipate discharge tomorrow.            Suzan Nailer, CNM, MSN 10/23/2021, 6:01 PM

## 2021-10-24 MED ORDER — MAGNESIUM OXIDE -MG SUPPLEMENT 400 (240 MG) MG PO TABS: 400.0000 mg | ORAL_TABLET | Freq: Every day | ORAL | Status: DC

## 2021-10-24 MED ORDER — IBUPROFEN 800 MG PO TABS
800.0000 mg | ORAL_TABLET | Freq: Three times a day (TID) | ORAL | 0 refills | Status: DC
Start: 2021-10-24 — End: 2023-06-29

## 2021-10-24 MED ORDER — OXYCODONE HCL 5 MG PO TABS: 5.0000 mg | ORAL_TABLET | Freq: Four times a day (QID) | ORAL | 0 refills | Status: AC | PRN

## 2021-10-24 MED ORDER — ACETAMINOPHEN 500 MG PO TABS
1000.0000 mg | ORAL_TABLET | Freq: Four times a day (QID) | ORAL | 0 refills | Status: DC
Start: 2021-10-24 — End: 2023-06-29

## 2021-10-24 MED ORDER — COCONUT OIL OIL: 1.0000 "application " | TOPICAL_OIL | 0 refills | Status: DC | PRN

## 2021-10-24 MED ORDER — POLYSACCHARIDE IRON COMPLEX 150 MG PO CAPS: 150.0000 mg | ORAL_CAPSULE | Freq: Every day | ORAL | Status: DC

## 2021-10-24 NOTE — Lactation Note (Signed)
This note was copied from a baby's chart. Lactation Consultation Note  Patient Name: Boy Karmin Kasprzak OMVEH'M Date: 10/24/2021 Reason for consult: Follow-up assessment Age:36 hours  P1 mother whose infant is now 5 hours old.  This is an ETI at 38+2 weeks.  Mother had no questions/concerns related to breast feeding.  She feels comfortable with latching.  Baby has been voiding/stooling well.  Mother does have a positional stripe to her right nipple; discussed how to best alleviate a shallow latch.  Right nipple rounded and intact.  Suggested EBM/coconut oil for nipple tenderness; mother is using her coconut oil.  Parents have our OP phone number for any further concerns.  Mother will be getting a DEBP from a friend.  Father present and preparing the car seat for discharge.  RN updated.    Maternal Data    Feeding    LATCH Score                    Lactation Tools Discussed/Used    Interventions    Discharge Discharge Education: Engorgement and breast care  Consult Status Consult Status: Complete Date: 10/24/21 Follow-up type: Call as needed    Bethany Hirt R Tanae Petrosky 10/24/2021, 11:17 AM

## 2021-10-24 NOTE — Discharge Instructions (Signed)
Lactation outpatient support - home visit ° ° °Jessica Bowers, IBCLC (lactation consultant)  & Birth Doula ° °Phone (text or call): [phone] °Email: [email or web address] °[email or web address] ° ° °Linda Coppola °RN, MHA, IBCLC °at [facility]: Lactation Consultant ° °[email or web address]/ °Mail: [email or web address] °Tel: [phone] ° ° °Additional breastfeeding resources: ° °[facility] °https://ibconline.ca/information-sheets/ ° °[employer] [place] ° °www.lllofnc.org ° ° °Other Resources: ° °Chiropractic specialist  ° °Dr. Leanna Hastings °[email or web address] ° ° °Craniosacral therapy for baby ° °Erin Balkind  °[email or web address]://[email or web address]/ ° °

## 2021-10-24 NOTE — Discharge Summary (Signed)
OB Discharge Summary  Patient Name: Ariel [name] DOB: Aug 09, 1986 MRN: [id number]  Date of admission: 10/21/2021 Delivering provider: Aloha Gell   Admitting diagnosis: History of myomectomy [Z98.890] S/P cesarean section [Z98.891] Intrauterine pregnancy: [redacted]w[redacted]d     Secondary diagnosis: Patient Active Problem List   Diagnosis Date Noted   Postpartum care following cesarean delivery 12/12 10/22/2021   PAI-1  10/22/2021   Hypothyroidism 10/22/2021   Anxiety and depression 10/22/2021   Anemia associated with acute blood loss EBL 925cc 10/22/2021   History of myomectomy 10/21/2021   S/P cesarean section 10/21/2021   Additional problems:none   Date of discharge: 10/24/2021   Discharge diagnosis: Principal Problem:   Postpartum care following cesarean delivery 12/12 Active Problems:   History of myomectomy   S/P cesarean section   PAI-1    Hypothyroidism   Anxiety and depression   Anemia associated with acute blood loss EBL 925cc                                                              Post partum procedures: none  Augmentation: N/A Pain control: Spinal  Laceration:None  Episiotomy:None  Complications: None  Hospital course:  Sceduled C/S   35 y.o. yo G3P1021 at [redacted]w[redacted]d was admitted to the hospital 10/21/2021 for scheduled cesarean section with the following indication:Prior Uterine Surgery.Delivery details are as follows:  Membrane Rupture Time/Date: 8:06 AM ,10/21/2021   Delivery Method:C-Section, Low Transverse  Details of operation can be found in separate operative note.  Patient had an uncomplicated postpartum course.  She is ambulating, tolerating a regular diet, passing flatus, and urinating well. Patient is discharged home in stable condition on  10/24/21        Newborn Data: Birth date:10/21/2021  Birth time:8:07 AM  Gender:Female  Living status:Living  Apgars:7 ,8  Weight:3700 g     Physical exam  Vitals:   10/23/21 0534 10/23/21 0900  10/23/21 1425 10/23/21 2030  BP: 120/86 107/73 120/71 110/77  Pulse: 86 78 84 86  Resp: 17 16 16 16   Temp: 98.2 F (36.8 C)  98.5 F (36.9 C) 98.6 F (37 C)  TempSrc: Oral  Oral Oral  SpO2: 99%   100%  Weight:      Height:       General: alert, cooperative, and no distress Lochia: appropriate Uterine Fundus: firm Incision: No significant erythema, Dressing is clean, dry, and intact DVT Evaluation: No cords or calf tenderness. Calf/Ankle edema is present Labs: Lab Results  Component Value Date   WBC 14.6 (H) 10/22/2021   HGB 9.7 (L) 10/22/2021   HCT 29.3 (L) 10/22/2021   MCV 87.5 10/22/2021   PLT 175 10/22/2021   CMP Latest Ref Rng & Units 10/04/2018  Glucose 70 - 99 mg/dL 119(H)  BUN 6 - 20 mg/dL 16  Creatinine 0.44 - 1.00 mg/dL 0.88  Sodium 135 - 145 mmol/L 140  Potassium 3.5 - 5.1 mmol/L 4.1  Chloride 98 - 111 mmol/L 104  CO2 22 - 32 mmol/L 25  Calcium 8.9 - 10.3 mg/dL 9.5  Total Protein 6.5 - 8.1 g/dL 7.4  Total Bilirubin 0.3 - 1.2 mg/dL 0.5  Alkaline Phos 38 - 126 U/L 56  AST 15 - 41 U/L 18  ALT 0 - 44 U/L 13   Edinburgh  Postnatal Depression Scale Screening Tool 10/23/2021 10/23/2021  I have been able to laugh and see the funny side of things. 0 (No Data)  I have looked forward with enjoyment to things. 0 -  I have blamed myself unnecessarily when things went wrong. 1 -  I have been anxious or worried for no good reason. 1 -  I have felt scared or panicky for no good reason. 1 -  Things have been getting on top of me. 2 -  I have been so unhappy that I have had difficulty sleeping. 0 -  I have felt sad or miserable. 1 -  I have been so unhappy that I have been crying. 1 -  The thought of harming myself has occurred to me. 0 -  Edinburgh Postnatal Depression Scale Total 7 -   Vaccines: TDaP          UTD        COVID-19   UTD        Flu             UTD  Discharge instruction:  per After Visit Summary,  [facility] OB booklet and  "Understanding Mother &  Baby Care" hospital booklet  After Visit Meds:  Allergies as of 10/24/2021   No Known Allergies      Medication List     STOP taking these medications    aspirin EC 81 MG tablet       TAKE these medications    acetaminophen 500 MG tablet Commonly known as: TYLENOL Take 2 tablets (1,000 mg total) by mouth every 6 (six) hours.   coconut oil Oil Apply 1 application topically as needed.   docusate sodium 100 MG capsule Commonly known as: COLACE Take 200 mg by mouth daily.   ibuprofen 800 MG tablet Commonly known as: ADVIL Take 1 tablet (800 mg total) by mouth every 8 (eight) hours.   iron polysaccharides 150 MG capsule Commonly known as: Ferrex 150 Take 1 capsule (150 mg total) by mouth daily.   levothyroxine 50 MCG tablet Commonly known as: SYNTHROID Take 50 mcg by mouth daily before breakfast.   magnesium oxide 400 (240 Mg) MG tablet Commonly known as: MAG-OX Take 1 tablet (400 mg total) by mouth daily. For prevention of constipation.   multivitamin-lutein Caps capsule Take 1 capsule by mouth daily.   oxyCODONE 5 MG immediate release tablet Commonly known as: Oxy IR/ROXICODONE Take 1 tablet (5 mg total) by mouth every 6 (six) hours as needed for up to 5 days for moderate pain.   prenatal multivitamin Tabs tablet Take 1 tablet by mouth daily.   Vitamin D (Cholecalciferol) 25 MCG (1000 UT) Tabs Take 1,000 Units by mouth daily.   Vitamin E 180 MG (400 UNIT) Caps Take 400 Units by mouth daily.   zinc gluconate 50 MG tablet Take 50 mg by mouth daily.        Diet: routine diet  Activity: Advance as tolerated. Pelvic rest for 6 weeks.   Postpartum contraception: TBA in office  Newborn Data: Live born female  Birth Weight: 8 lb 2.5 oz (3700 g) APGAR: 7, 8  Newborn Delivery   Birth date/time: 10/21/2021 08:07:00 Delivery type: C-Section, Low Transverse Trial of labor: No C-section categorization: Primary      named Cuillin Baby Feeding:  Breast Disposition:home with mother Circumcision: completed   Delivery Report:  Review the Delivery Report for details.    Follow up:  Follow-up Information     [place],  Claiborne Billings, MD. Schedule an appointment as soon as possible for a visit in 6 week(s).   Specialty: Obstetrics and Gynecology Why: For Postpartum follow-up Contact information: [address] [place] [address] [phone]                   Signed: Juliene Pina, CNM, MSN 10/24/2021, 11:19 AM

## 2021-11-05 ENCOUNTER — Telehealth (HOSPITAL_COMMUNITY): Payer: Self-pay | Admitting: *Deleted

## 2021-11-05 NOTE — Telephone Encounter (Signed)
Patient voiced no questions or concerns at this time. EPDS=2. Patient voiced no questions or concerns regarding infant at this time. Patient reports infant sleeps in a cradle on his back. RN reviewed ABCs of safe sleep. Patient verbalized understanding. Patient requested RN email information on hospital's virtual postpartum classes and support groups. Email sent. Erline Levine, RN,  11/05/21, [phone]

## 2022-01-31 ENCOUNTER — Encounter (HOSPITAL_COMMUNITY): Payer: Self-pay

## 2022-01-31 ENCOUNTER — Other Ambulatory Visit: Payer: Self-pay

## 2022-01-31 ENCOUNTER — Ambulatory Visit (HOSPITAL_COMMUNITY)
Admission: EM | Admit: 2022-01-31 | Discharge: 2022-01-31 | Disposition: A | Discharge status: Home / Self Care | Payer: BC Managed Care – PPO | Attending: Urgent Care | Admitting: Urgent Care

## 2022-01-31 DIAGNOSIS — H1013 Acute atopic conjunctivitis, bilateral: Secondary | ICD-10-CM

## 2022-01-31 MED ORDER — AZELASTINE HCL 0.05 % OP SOLN: 1.0000 [drp] | Freq: Two times a day (BID) | OPHTHALMIC | 0 refills | Status: DC

## 2022-01-31 NOTE — ED Triage Notes (Signed)
Onset yesterday of left eye redness. Pt reports when she woke up this morning both eyes were red. Denies drainage and crusty. No meds or eyedrops used. NO decrease in visual acuity. ?

## 2022-01-31 NOTE — ED Provider Notes (Signed)
?[place] ? ? ? ?CSN: [id number] ?Arrival date & time: 01/31/22  1218 ? ? ?  ? ?History   ?Chief Complaint ?Chief Complaint  ?Patient presents with  ? Conjunctivitis  ?  bilateral  ? ? ?HPI ?Ariel [name] is a 36 y.o. female.  ? ?Pleasant 36 year old female presents today with concerns of possible conjunctivitis.  She states that yesterday she started developing a red eye on her left, and woke up and both eyes were red and irritated this morning.  She denies any discharge.  She denies any URI symptoms.  She denies any history of known allergies.  No change in make-up.  She does have a 77-monthold at home but he is well and healthy.  He does not attend daycare. Pt denies headache, fever, photophobia. She admits the sx have improved slightly since waking up. ? ? ?Conjunctivitis ? ? ?Past Medical History:  ?Diagnosis Date  ? Anxiety   ? Chronic kidney disease   ? Depression   ? Fibroid   ? 11x7 fibroid removed by Dr. YDarreld Mclean ? History of kidney stones   ? HSV (herpes simplex virus) infection   ? Hyperlipidemia   ? Hypoglycemia   ? Hypothyroidism   ? Newborn product of IVF pregnancy   ? ? ?Patient Active Problem List  ? Diagnosis Date Noted  ? Postpartum care following cesarean delivery 12/12 10/22/2021  ? 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 Hypothyroidism 10/22/2021  ? Anxiety and depression 10/22/2021  ? Anemia associated with acute blood loss EBL 925cc 10/22/2021  ? History of myomectomy 10/21/2021  ? S/P cesarean section 10/21/2021  ? ? ?Past Surgical History:  ?Procedure Laterality Date  ? CESAREAN SECTION N/A 10/21/2021  ? Procedure: Primary CESAREAN SECTION;  Surgeon: FAloha Gell MD;  [facility];  Service: Obstetrics;  Laterality: N/A;  EDD: 11/02/21  ? DILATION AND EVACUATION  03/2020  ? preformed in Dr office  ? EXTRACORPOREAL SHOCK WAVE LITHOTRIPSY Right 10/11/2018  ? Procedure: EXTRACORPOREAL SHOCK WAVE LITHOTRIPSY (ESWL);  Surgeon: HArdis Hughs MD;  [facility];  Service: Urology;   Laterality: Right;  ? GANGLION CYST EXCISION Right 04/21/2017  ? Procedure: OPEN EXCISION DORSAL ULNAR GANGLION RIGHT WRIST;  Surgeon: KDaryll Brod MD;  [facility]  Service: Orthopedics;  Laterality: Right;  ? HYSTEROSCOPY N/A 10/10/2020  ? Procedure: HYSTEROSCOPY, POLYPECTOMY;  Surgeon: YGovernor Specking MD;  [facility]  Service: Gynecology;  Laterality: N/A;  ? LAPAROSCOPIC GELPORT ASSISTED MYOMECTOMY N/A 10/10/2020  ? Procedure: LAPAROSCOPIC GELPORT ASSISTED MYOMECTOMY; ABLATION OF ENDOMETRIOSIS; CHROMOTUBATION;  Surgeon: YGovernor Specking MD;  [facility]  Service: Gynecology;  Laterality: N/A;  ? LEEP    ? REFRACTIVE SURGERY  2018  ? wisdom teeth extraction    ? WRIST ARTHROSCOPY WITH DEBRIDEMENT Right 04/21/2017  ? Procedure: ARTHROSCOPY RIGHT WRIST with debridement;  Surgeon: KDaryll Brod MD;  [facility]  Service: Orthopedics;  Laterality: Right;  ? ? ?OB History   ? ? Gravida  ?3  ? Para  ?1  ? Term  ?1  ? Preterm  ?   ? AB  ?2  ? Living  ?1  ?  ? ? SAB  ?2  ? IAB  ?   ? Ectopic  ?   ? Multiple  ?0  ? Live Births  ?1  ?   ?  ?  ? ? ? ?Home Medications   ? ?Prior to  Admission medications   ?Medication Sig Start Date End Date Taking? Authorizing Provider  ?azelastine (OPTIVAR) 0.05 % ophthalmic solution Place 1 drop into both eyes 2 (two) times daily. 01/31/22  Yes Tanaya Dunigan L, PA  ?acetaminophen (TYLENOL) 500 MG tablet Take 2 tablets (1,000 mg total) by mouth every 6 (six) hours. 10/24/21   Juliene Pina, CNM  ?coconut oil OIL Apply 1 application topically as needed. 10/24/21   Juliene Pina, CNM  ?docusate sodium (COLACE) 100 MG capsule Take 200 mg by mouth daily.    [provider]  ?ibuprofen (ADVIL) 800 MG tablet Take 1 tablet (800 mg total) by mouth every 8 (eight) hours. 10/24/21   Juliene Pina, CNM  ?iron polysaccharides (FERREX 150) 150 MG capsule Take 1 capsule (150 mg total)  by mouth daily. 10/24/21   Juliene Pina, CNM  ?levothyroxine (SYNTHROID) 50 MCG tablet Take 50 mcg by mouth daily before breakfast.    [provider]  ?magnesium oxide (MAG-OX) 400 (240 Mg) MG tablet Take 1 tablet (400 mg total) by mouth daily. For prevention of constipation. 10/24/21   Juliene Pina, CNM  ?multivitamin-lutein (OCUVITE-LUTEIN) CAPS capsule Take 1 capsule by mouth daily.    [provider]  ?Prenatal Vit-Fe Fumarate-FA (PRENATAL MULTIVITAMIN) TABS tablet Take 1 tablet by mouth daily.    [provider]  ?Vitamin D, Cholecalciferol, 1000 units TABS Take 1,000 Units by mouth daily.     [provider]  ?Vitamin E 180 MG (400 UNIT) CAPS Take 400 Units by mouth daily.     [provider]  ?zinc gluconate 50 MG tablet Take 50 mg by mouth daily.    [provider]  ? ? ?Family History ?Family History  ?Problem Relation Age of Onset  ? Cancer Father   ? Heart failure Father   ? Stroke Maternal Grandmother   ? Cancer Maternal Grandfather   ? Heart attack Paternal Grandfather   ? ? ?Social History ?Social History  ? ?Tobacco Use  ? Smoking status: Every Day  ?  Packs/day: 0.25  ?  Years: 18.00  ?  Pack years: 4.50  ?  Types: Cigarettes  ? Smokeless tobacco: Never  ?Vaping Use  ? Vaping Use: Never used  ?Substance Use Topics  ? Alcohol use: Yes  ?  Comment: minimum to moderate 4-5 drinks per week   ? Drug use: Not Currently  ?  Types: Marijuana  ? ? ? ?Allergies   ?Patient has no known allergies. ? ? ?Review of Systems ?Review of Systems  ?Eyes:  Positive for redness.  ?All other systems reviewed and are negative. ? ? ?Physical Exam ?Triage Vital Signs ?ED Triage Vitals  ?Enc Vitals Group  ?   BP 01/31/22 1330 112/76  ?   Pulse Rate 01/31/22 1330 79  ?   Resp 01/31/22 1330 18  ?   Temp 01/31/22 1330 98.4 ?F (36.9 ?C)  ?   Temp Source 01/31/22 1330 Oral  ?   SpO2 01/31/22 1330 98 %  ?   Weight --   ?   Height --   ?   Head Circumference --   ?   Peak  Flow --   ?   Pain Score 01/31/22 1328 0  ?   Pain Loc --   ?   Pain Edu? --   ?   Excl. in [place]? --   ? ?No data found. ? ?Updated Vital Signs ?BP 112/76 (BP Location:  Left Arm)   Pulse 79   Temp 98.4 ?F (36.9 ?C) (Oral)   Resp 18   SpO2 98%   Breastfeeding Yes  ? ?Visual Acuity ?Right Eye Distance:   ?Left Eye Distance:   ?Bilateral Distance:   ? ?Right Eye Near:   ?Left Eye Near:    ?Bilateral Near:    ? ?Physical Exam ?Vitals and nursing note reviewed.  ?Constitutional:   ?   General: She is not in acute distress. ?   Appearance: Normal appearance. She is normal weight. She is not ill-appearing, toxic-appearing or diaphoretic.  ?HENT:  ?   Head: Normocephalic and atraumatic.  ?   Right Ear: Tympanic membrane, ear canal and external ear normal. There is no impacted cerumen.  ?   Left Ear: Tympanic membrane, ear canal and external ear normal. There is no impacted cerumen.  ?   Nose: Nose normal. No congestion or rhinorrhea.  ?   Mouth/Throat:  ?   Mouth: Mucous membranes are moist.  ?   Pharynx: Oropharynx is clear. No oropharyngeal exudate or posterior oropharyngeal erythema.  ?Eyes:  ?   General: No scleral icterus.    ?   Right eye: No discharge.     ?   Left eye: No discharge.  ?   Extraocular Movements: Extraocular movements intact.  ?   Pupils: Pupils are equal, round, and reactive to light.  ?   Comments: MINIMAL scleral injection with no discharge, drainage, FB, tearing. No chemosis or ciliary flush.  ?Cardiovascular:  ?   Rate and Rhythm: Normal rate.  ?Pulmonary:  ?   Effort: Pulmonary effort is normal. No respiratory distress.  ?   Breath sounds: No wheezing.  ?Musculoskeletal:  ?   Cervical back: Normal range of motion.  ?Lymphadenopathy:  ?   Cervical: No cervical adenopathy.  ?Skin: ?   General: Skin is warm.  ?   Findings: No erythema or rash.  ?Neurological:  ?   General: No focal deficit present.  ?   Mental Status: She is alert and oriented to person, place, and time.  ? ? ? ?UC Treatments /  Results  ?Labs ?(all labs ordered are listed, but only abnormal results are displayed) ?Labs Reviewed - No data to display ? ?EKG ? ? ?Radiology ?No results found. ? ?Procedures ?Procedures (including critic

## 2022-01-31 NOTE — Discharge Instructions (Addendum)
Your symptoms look consistent with allergic conjunctivitis. ?Please start taking the eye drops up to twice daily, as needed. ?Monitor for any change in your symptoms which would warrant follow up - thick discharge, change in vision, pain with movement of eye. ?Avoid touching eyes and wash hands frequently. ?

## 2022-11-11 LAB — OB RESULTS CONSOLE ANTIBODY SCREEN: Antibody Screen: NEGATIVE

## 2022-11-11 LAB — OB RESULTS CONSOLE ABO/RH: RH Type: POSITIVE

## 2022-12-23 LAB — OB RESULTS CONSOLE GC/CHLAMYDIA
Chlamydia: NEGATIVE
Neisseria Gonorrhea: NEGATIVE

## 2022-12-23 LAB — OB RESULTS CONSOLE HEPATITIS B SURFACE ANTIGEN: Hepatitis B Surface Ag: NEGATIVE

## 2022-12-23 LAB — OB RESULTS CONSOLE HIV ANTIBODY (ROUTINE TESTING): HIV: NONREACTIVE

## 2022-12-23 LAB — OB RESULTS CONSOLE RUBELLA ANTIBODY, IGM: Rubella: IMMUNE

## 2022-12-23 LAB — OB RESULTS CONSOLE RPR: RPR: NONREACTIVE

## 2022-12-23 LAB — HEPATITIS C ANTIBODY: HCV Ab: NEGATIVE

## 2023-04-29 ENCOUNTER — Other Ambulatory Visit: Payer: Self-pay | Admitting: Obstetrics

## 2023-06-03 ENCOUNTER — Other Ambulatory Visit: Payer: Self-pay | Admitting: Obstetrics

## 2023-06-24 ENCOUNTER — Telehealth (HOSPITAL_COMMUNITY): Payer: Self-pay | Admitting: *Deleted

## 2023-06-24 ENCOUNTER — Encounter (HOSPITAL_COMMUNITY): Payer: Self-pay

## 2023-06-24 NOTE — Patient Instructions (Addendum)
Ariel [name]  06/24/2023   Your procedure is scheduled on:  07/09/2023  Arrive at 1145 at Entrance C on [address] at [facility]  and [employer]. You are invited to use the FREE valet parking or use the Visitor's parking deck.  Pick up the phone at the desk and dial [phone].  Call this number if you have problems the morning of surgery: [phone]  Remember:   Do not eat food:(After Midnight) Desps de medianoche.  Do not drink clear liquids: (After Midnight) Desps de medianoche.  Take these medicines the morning of surgery with A SIP OF WATER:  levothyroxine   Do not wear jewelry, make-up or nail polish.  Do not wear lotions, powders, or perfumes. Do not wear deodorant.  Do not shave 48 hours prior to surgery.  Do not bring valuables to the hospital.  [facility] is not   responsible for any belongings or valuables brought to the hospital.  Contacts, dentures or bridgework may not be worn into surgery.  Leave suitcase in the car. After surgery it may be brought to your room.  For patients admitted to the hospital, checkout time is 11:00 AM the day of              discharge.      Please read over the following fact sheets that you were given:     Preparing for Surgery

## 2023-06-24 NOTE — Telephone Encounter (Signed)
Preadmission screen  

## 2023-06-26 ENCOUNTER — Telehealth (HOSPITAL_COMMUNITY): Payer: Self-pay | Admitting: *Deleted

## 2023-06-26 NOTE — Telephone Encounter (Signed)
Preadmission screen  

## 2023-06-29 ENCOUNTER — Telehealth (HOSPITAL_COMMUNITY): Payer: Self-pay | Admitting: *Deleted

## 2023-06-29 NOTE — Telephone Encounter (Signed)
Preadmission screen  

## 2023-06-30 ENCOUNTER — Encounter (HOSPITAL_COMMUNITY): Payer: Self-pay

## 2023-07-06 ENCOUNTER — Inpatient Hospital Stay (HOSPITAL_COMMUNITY)
Admission: RE | Admit: 2023-07-06 | Discharge: 2023-07-06 | Disposition: A | Discharge status: Home / Self Care | Payer: BC Managed Care – PPO | Source: Ambulatory Visit

## 2023-07-06 NOTE — Patient Instructions (Signed)
KATHRIN SANDROCK  07/06/2023   Your procedure is scheduled on:  07/09/2023  Arrive at 1200 at Entrance C on [address] at [facility]  and [employer]. You are invited to use the FREE valet parking or use the Visitor's parking deck.  Pick up the phone at the desk and dial [phone].  Call this number if you have problems the morning of surgery: [phone]  Remember:   Do not eat food:(After Midnight) Desps de medianoche.  Do not drink clear liquids: (4 Hours before arrival) 4 horas ante llegada.  Take these medicines the morning of surgery with A SIP OF WATER:  Levothyroxine and valtrex   Do not wear jewelry, make-up or nail polish.  Do not wear lotions, powders, or perfumes. Do not wear deodorant.  Do not shave 48 hours prior to surgery.  Do not bring valuables to the hospital.  [facility] is not   responsible for any belongings or valuables brought to the hospital.  Contacts, dentures or bridgework may not be worn into surgery.  Leave suitcase in the car. After surgery it may be brought to your room.  For patients admitted to the hospital, checkout time is 11:00 AM the day of              discharge.      Please read over the following fact sheets that you were given:     Preparing for Surgery

## 2023-07-07 ENCOUNTER — Encounter (HOSPITAL_COMMUNITY)
Admission: RE | Admit: 2023-07-07 | Discharge: 2023-07-07 | Disposition: A | Discharge status: Home / Self Care | Payer: BC Managed Care – PPO | Source: Ambulatory Visit | Attending: Obstetrics & Gynecology | Admitting: Obstetrics & Gynecology

## 2023-07-07 DIAGNOSIS — Z01812 Encounter for preprocedural laboratory examination: Secondary | ICD-10-CM | POA: Insufficient documentation

## 2023-07-07 LAB — CBC
HCT: 44.2 % (ref 36.0–46.0)
Hemoglobin: 14.4 g/dL (ref 12.0–15.0)
MCH: 28.5 pg (ref 26.0–34.0)
MCHC: 32.6 g/dL (ref 30.0–36.0)
MCV: 87.4 fL (ref 80.0–100.0)
Platelets: 244 10*3/uL (ref 150–400)
RBC: 5.06 MIL/uL (ref 3.87–5.11)
RDW: 13.7 % (ref 11.5–15.5)
WBC: 13.4 10*3/uL — ABNORMAL HIGH (ref 4.0–10.5)
nRBC: 0 % (ref 0.0–0.2)

## 2023-07-07 LAB — TYPE AND SCREEN
ABO/RH(D): O POS
Antibody Screen: NEGATIVE

## 2023-07-07 LAB — RPR: RPR Ser Ql: NONREACTIVE

## 2023-07-08 NOTE — H&P (Incomplete)
Pt seen, agree with note and plan --V.Ayo Smoak MD   Ariel [name] is a 37 y.o. female presenting for repeat C-section for h/o myomectomy + FMs. No LOF. No UCs Pt is 38.5 wks and preferred this date for surgery due kid's school start.   37 yo G3P1011 (c/s for h/o myomectomy and prior MAB) EDC 07/18/23 by LMP c/w 8 wk u/s.  H/o IVF preg- female factor and fibroids but this is a spontaneous suprise preg. H/o endometriosis.  Due to PAI-1, she did heparin in 1st trimester then stopped at 12 wks with 1st pregnancy and had no complications, so same followed this pregnancy H/o  11x7cm fibroid with L'scopic myomectomy 12/21 with Dr. Jeannie Fend. 9 wk MAB with IPAS in 5/21.  no h/o abnl pap. Nl pap this pregnancy.  Hypothyroidism, managed at Ob. 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 Anatomy sono WNL  Failed 1 hr Glucola, but passed 3hr GTT  Hypothyr, TFT each trim. Growth sono - last at 33.5 wks-- vtx, ant placneta, AFI 21 cm, EFW 5'15" at 91%,   OB History     Gravida  4   Para  1   Term  1   Preterm      AB  2   Living  1      SAB  2   IAB      Ectopic      Multiple  0   Live Births  1          Past Medical History:  Diagnosis Date   Anxiety    Chronic kidney disease    Depression    Fibroid    11x7 fibroid removed by Dr. Jeannie Fend   History of kidney stones    History of kidney stones    HSV (herpes simplex virus) infection    Hyperlipidemia    Hypoglycemia    Hypothyroidism    Past Surgical History:  Procedure Laterality Date   CESAREAN SECTION N/A 10/21/2021   Procedure: Primary CESAREAN SECTION;  Surgeon: Noland Fordyce, MD;  [facility];  Service: Obstetrics;  Laterality: N/A;  EDD: 11/02/21   DILATION AND EVACUATION  03/2020   preformed in Dr office   EXTRACORPOREAL SHOCK WAVE LITHOTRIPSY Right 10/11/2018   Procedure: EXTRACORPOREAL SHOCK WAVE LITHOTRIPSY (ESWL);  Surgeon:  Crist Fat, MD;  Location[facility];  Service: Urology;  Laterality: Right;   GANGLION CYST EXCISION Right 04/21/2017   Procedure: OPEN EXCISION DORSAL ULNAR GANGLION RIGHT WRIST;  Surgeon: Cindee Salt, MD;  Location: [facility];  Service: Orthopedics;  Laterality: Right;   HYSTEROSCOPY N/A 10/10/2020   Procedure: HYSTEROSCOPY, POLYPECTOMY;  Surgeon: Fermin Schwab, MD;  Location: [facility];  Service: Gynecology;  Laterality: N/A;   LAPAROSCOPIC GELPORT ASSISTED MYOMECTOMY N/A 10/10/2020   Procedure: LAPAROSCOPIC GELPORT ASSISTED MYOMECTOMY; ABLATION OF ENDOMETRIOSIS; CHROMOTUBATION;  Surgeon: Fermin Schwab, MD;  Location: [facility];  Service: Gynecology;  Laterality: N/A;   LEEP     REFRACTIVE SURGERY  2018   wisdom teeth extraction     WRIST ARTHROSCOPY WITH DEBRIDEMENT Right 04/21/2017   Procedure: ARTHROSCOPY RIGHT WRIST with debridement;  Surgeon: Cindee Salt, MD;  Location: [facility];  Service: Orthopedics;  Laterality: Right;   Family History: family history includes Cancer in her father and maternal grandfather; Heart attack in her paternal grandfather; Heart failure in her  father; Stroke in her maternal grandmother. Social History:  reports that she has quit smoking. Her smoking use included cigarettes. She started smoking about 18 years ago. She has a 3.8 pack-year smoking history. She has never used smokeless tobacco. She reports current alcohol use. She reports that she does not currently use drugs after having used the following drugs: Marijuana.     Maternal Diabetes: No Genetic Screening: Normal  NIPT  Maternal Ultrasounds/Referrals: Normal Fetal Ultrasounds or other Referrals:  None Maternal Substance Abuse:  No Significant Maternal Medications:  Heparin 1st trim. Levothyroxine Significant Maternal Lab Results:  Group B Strep negative Number of Prenatal Visits:greater than 3 verified prenatal  visits Other Comments:  None  Physical Exam   A&O x 3, no acute distress. Pleasant HEENT neg, no thyromegaly Lungs CTA bilat CV RRR, S1S2 normal Abdo soft, non tender, non acute Extr no edema/ tenderness Pelvic Cx closed, long  FHT  130s Toco none  Prenatal labs: ABO, Rh: --/--/O POS (08/27 1047) Antibody: NEG (08/27 1047) Rubella: Immune (02/13 0000) RPR: NON REACTIVE (08/27 1100)  HBsAg: Negative (02/13 0000)  HIV: Non-reactive (02/13 0000)  GBS:   neg Hep C neg  AFP1 neg NIPT low risk   Assessment/Plan: 37 yo G3P1011, 38.5 wks with h/o myomectomy and one C/section since, is here for repeat C/section  Risks/complications of surgery reviewed incl infection, bleeding, damage to internal organs including bladder, bowels, ureters, blood vessels, other risks from anesthesia, VTE and delayed complications of any surgery, complications in future surgery reviewed. Also discussed neonatal complications incl difficult delivery, laceration, vacuum assistance, TTN etc. Pt understands and agrees, all concerns addressed.      Robley Fries MD Ma Hillock Melrose Nakayama

## 2023-07-09 ENCOUNTER — Other Ambulatory Visit: Payer: Self-pay

## 2023-07-09 ENCOUNTER — Encounter (HOSPITAL_COMMUNITY): Payer: Self-pay | Admitting: Obstetrics & Gynecology

## 2023-07-09 ENCOUNTER — Encounter (HOSPITAL_COMMUNITY)
Admission: RE | Disposition: A | Discharge status: Home / Self Care | Payer: Self-pay | Source: Home / Self Care | Attending: Obstetrics & Gynecology

## 2023-07-09 ENCOUNTER — Inpatient Hospital Stay (HOSPITAL_COMMUNITY): Payer: BC Managed Care – PPO

## 2023-07-09 ENCOUNTER — Inpatient Hospital Stay (HOSPITAL_COMMUNITY)
Admission: RE | Admit: 2023-07-09 | Payer: BC Managed Care – PPO | Source: Home / Self Care | Attending: Obstetrics & Gynecology | Admitting: Obstetrics & Gynecology

## 2023-07-09 DIAGNOSIS — Z87891 Personal history of nicotine dependence: Secondary | ICD-10-CM | POA: Diagnosis not present

## 2023-07-09 DIAGNOSIS — O99284 Endocrine, nutritional and metabolic diseases complicating childbirth: Secondary | ICD-10-CM | POA: Diagnosis present

## 2023-07-09 DIAGNOSIS — Z9889 Other specified postprocedural states: Secondary | ICD-10-CM

## 2023-07-09 DIAGNOSIS — Z87442 Personal history of urinary calculi: Secondary | ICD-10-CM

## 2023-07-09 DIAGNOSIS — O9832 Other infections with a predominantly sexual mode of transmission complicating childbirth: Secondary | ICD-10-CM | POA: Diagnosis present

## 2023-07-09 DIAGNOSIS — O34211 Maternal care for low transverse scar from previous cesarean delivery: Secondary | ICD-10-CM | POA: Diagnosis present

## 2023-07-09 DIAGNOSIS — Z98891 History of uterine scar from previous surgery: Secondary | ICD-10-CM

## 2023-07-09 DIAGNOSIS — Z3A38 38 weeks gestation of pregnancy: Secondary | ICD-10-CM | POA: Diagnosis not present

## 2023-07-09 DIAGNOSIS — Z1589 Genetic susceptibility to other disease: Secondary | ICD-10-CM

## 2023-07-09 DIAGNOSIS — E039 Hypothyroidism, unspecified: Secondary | ICD-10-CM | POA: Diagnosis present

## 2023-07-09 DIAGNOSIS — A6000 Herpesviral infection of urogenital system, unspecified: Secondary | ICD-10-CM | POA: Diagnosis present

## 2023-07-09 DIAGNOSIS — F419 Anxiety disorder, unspecified: Secondary | ICD-10-CM | POA: Diagnosis present

## 2023-07-09 SURGERY — Surgical Case
Anesthesia: Spinal

## 2023-07-09 MED ORDER — SIMETHICONE 80 MG PO CHEW
80.0000 mg | CHEWABLE_TABLET | Freq: Three times a day (TID) | ORAL | Status: DC
  Administered 2023-07-10 – 2023-07-11 (×5): 80 mg via ORAL
  Filled 2023-07-09 (×5): qty 1

## 2023-07-09 MED ORDER — DIPHENHYDRAMINE HCL 50 MG/ML IJ SOLN: 12.5000 mg | INTRAMUSCULAR | Status: DC | PRN

## 2023-07-09 MED ORDER — KETOROLAC TROMETHAMINE 30 MG/ML IJ SOLN: 30.0000 mg | Freq: Four times a day (QID) | INTRAMUSCULAR | Status: DC | PRN

## 2023-07-09 MED ORDER — LACTATED RINGERS IV SOLN: INTRAVENOUS | Status: DC

## 2023-07-09 MED ORDER — ONDANSETRON HCL 4 MG/2ML IJ SOLN
INTRAMUSCULAR | Status: DC | PRN
Start: 2023-07-09 — End: 2023-07-09
  Administered 2023-07-09: 4 mg via INTRAVENOUS

## 2023-07-09 MED ORDER — MEPERIDINE HCL 25 MG/ML IJ SOLN: 6.2500 mg | INTRAMUSCULAR | Status: DC | PRN

## 2023-07-09 MED ORDER — ACETAMINOPHEN 500 MG PO TABS
1000.0000 mg | ORAL_TABLET | Freq: Four times a day (QID) | ORAL | Status: AC
  Administered 2023-07-10 (×4): 1000 mg via ORAL
  Filled 2023-07-09 (×3): qty 2

## 2023-07-09 MED ORDER — HYDROMORPHONE HCL 1 MG/ML IJ SOLN: 0.2500 mg | INTRAMUSCULAR | Status: DC | PRN

## 2023-07-09 MED ORDER — WITCH HAZEL-GLYCERIN EX PADS: 1.0000 | MEDICATED_PAD | CUTANEOUS | Status: DC | PRN

## 2023-07-09 MED ORDER — BUPIVACAINE IN DEXTROSE 0.75-8.25 % IT SOLN
INTRATHECAL | Status: DC | PRN
  Administered 2023-07-09: 1.6 mg via INTRATHECAL

## 2023-07-09 MED ORDER — KETOROLAC TROMETHAMINE 30 MG/ML IJ SOLN
30.0000 mg | Freq: Once | INTRAMUSCULAR | Status: AC | PRN
  Administered 2023-07-09: 30 mg via INTRAVENOUS

## 2023-07-09 MED ORDER — CEFAZOLIN SODIUM-DEXTROSE 2-4 GM/100ML-% IV SOLN
2.0000 g | INTRAVENOUS | Status: AC
  Administered 2023-07-09: 2 g via INTRAVENOUS

## 2023-07-09 MED ORDER — PROMETHAZINE HCL 25 MG/ML IJ SOLN: 6.2500 mg | INTRAMUSCULAR | Status: DC | PRN

## 2023-07-09 MED ORDER — LEVOTHYROXINE SODIUM 50 MCG PO TABS
50.0000 ug | ORAL_TABLET | Freq: Every day | ORAL | Status: DC
  Administered 2023-07-10 – 2023-07-11 (×2): 50 ug via ORAL
  Filled 2023-07-09 (×2): qty 1

## 2023-07-09 MED ORDER — ACETAMINOPHEN 10 MG/ML IV SOLN
INTRAVENOUS | Status: DC | PRN
  Administered 2023-07-09: 1000 mg via INTRAVENOUS

## 2023-07-09 MED ORDER — OXYTOCIN-SODIUM CHLORIDE 30-0.9 UT/500ML-% IV SOLN
INTRAVENOUS | Status: AC
  Filled 2023-07-09: qty 500

## 2023-07-09 MED ORDER — EPHEDRINE 5 MG/ML INJ
INTRAVENOUS | Status: AC
  Filled 2023-07-09: qty 5

## 2023-07-09 MED ORDER — KETOROLAC TROMETHAMINE 30 MG/ML IJ SOLN
INTRAMUSCULAR | Status: AC
  Filled 2023-07-09: qty 1

## 2023-07-09 MED ORDER — SODIUM CHLORIDE 0.9% FLUSH: 3.0000 mL | INTRAVENOUS | Status: DC | PRN

## 2023-07-09 MED ORDER — ACETAMINOPHEN 325 MG PO TABS
650.0000 mg | ORAL_TABLET | ORAL | Status: DC | PRN
  Administered 2023-07-11: 650 mg via ORAL
  Filled 2023-07-09 (×2): qty 2

## 2023-07-09 MED ORDER — EPHEDRINE SULFATE-NACL 50-0.9 MG/10ML-% IV SOSY
PREFILLED_SYRINGE | INTRAVENOUS | Status: DC | PRN
  Administered 2023-07-09: 5 mg via INTRAVENOUS
  Administered 2023-07-09: 10 mg via INTRAVENOUS

## 2023-07-09 MED ORDER — CEFAZOLIN SODIUM-DEXTROSE 2-4 GM/100ML-% IV SOLN
INTRAVENOUS | Status: AC
  Filled 2023-07-09: qty 100

## 2023-07-09 MED ORDER — MENTHOL 3 MG MT LOZG: 1.0000 | LOZENGE | OROMUCOSAL | Status: DC | PRN

## 2023-07-09 MED ORDER — DIBUCAINE (PERIANAL) 1 % EX OINT: 1.0000 | TOPICAL_OINTMENT | CUTANEOUS | Status: DC | PRN

## 2023-07-09 MED ORDER — IBUPROFEN 600 MG PO TABS
600.0000 mg | ORAL_TABLET | Freq: Four times a day (QID) | ORAL | Status: DC
  Administered 2023-07-10 – 2023-07-11 (×3): 600 mg via ORAL
  Filled 2023-07-09 (×2): qty 1

## 2023-07-09 MED ORDER — FENTANYL CITRATE (PF) 100 MCG/2ML IJ SOLN
INTRAMUSCULAR | Status: AC
  Filled 2023-07-09: qty 2

## 2023-07-09 MED ORDER — MORPHINE SULFATE (PF) 0.5 MG/ML IJ SOLN
INTRAMUSCULAR | Status: DC | PRN
  Administered 2023-07-09: 150 mg via INTRATHECAL

## 2023-07-09 MED ORDER — SIMETHICONE 80 MG PO CHEW: 80.0000 mg | CHEWABLE_TABLET | ORAL | Status: DC | PRN

## 2023-07-09 MED ORDER — OXYTOCIN-SODIUM CHLORIDE 30-0.9 UT/500ML-% IV SOLN: 2.5000 [IU]/h | INTRAVENOUS | Status: AC

## 2023-07-09 MED ORDER — SENNOSIDES-DOCUSATE SODIUM 8.6-50 MG PO TABS
2.0000 | ORAL_TABLET | Freq: Every day | ORAL | Status: DC
  Administered 2023-07-10: 2 via ORAL
  Filled 2023-07-09 (×2): qty 2

## 2023-07-09 MED ORDER — COCONUT OIL OIL
1.0000 | TOPICAL_OIL | Status: DC | PRN
  Administered 2023-07-11: 1 via TOPICAL

## 2023-07-09 MED ORDER — DIPHENHYDRAMINE HCL 25 MG PO CAPS
25.0000 mg | ORAL_CAPSULE | Freq: Four times a day (QID) | ORAL | Status: DC | PRN
  Administered 2023-07-10: 25 mg via ORAL

## 2023-07-09 MED ORDER — NALOXONE HCL 0.4 MG/ML IJ SOLN: 0.4000 mg | INTRAMUSCULAR | Status: DC | PRN

## 2023-07-09 MED ORDER — ONDANSETRON HCL 4 MG/2ML IJ SOLN: 4.0000 mg | Freq: Three times a day (TID) | INTRAMUSCULAR | Status: DC | PRN

## 2023-07-09 MED ORDER — PHENYLEPHRINE HCL-NACL 20-0.9 MG/250ML-% IV SOLN
INTRAVENOUS | Status: DC | PRN
  Administered 2023-07-09: 60 ug/min via INTRAVENOUS

## 2023-07-09 MED ORDER — OXYTOCIN-SODIUM CHLORIDE 30-0.9 UT/500ML-% IV SOLN
INTRAVENOUS | Status: DC | PRN
  Administered 2023-07-09: 300 mL via INTRAVENOUS

## 2023-07-09 MED ORDER — PRENATAL MULTIVITAMIN CH
1.0000 | ORAL_TABLET | Freq: Every day | ORAL | Status: DC
  Administered 2023-07-10 – 2023-07-11 (×2): 1 via ORAL
  Filled 2023-07-09 (×2): qty 1

## 2023-07-09 MED ORDER — OXYCODONE HCL 5 MG PO TABS
5.0000 mg | ORAL_TABLET | ORAL | Status: DC | PRN
  Administered 2023-07-11: 5 mg via ORAL
  Filled 2023-07-09: qty 1

## 2023-07-09 MED ORDER — OXYCODONE HCL 5 MG PO TABS: 5.0000 mg | ORAL_TABLET | Freq: Four times a day (QID) | ORAL | Status: DC | PRN

## 2023-07-09 MED ORDER — NALOXONE HCL 4 MG/10ML IJ SOLN: 1.0000 ug/kg/h | INTRAVENOUS | Status: DC | PRN

## 2023-07-09 MED ORDER — SCOPOLAMINE 1 MG/3DAYS TD PT72
MEDICATED_PATCH | TRANSDERMAL | Status: AC
  Filled 2023-07-09: qty 1

## 2023-07-09 MED ORDER — POVIDONE-IODINE 10 % EX SWAB
2.0000 | Freq: Once | CUTANEOUS | Status: AC
  Administered 2023-07-09: 2 via TOPICAL

## 2023-07-09 MED ORDER — SCOPOLAMINE 1 MG/3DAYS TD PT72
1.0000 | MEDICATED_PATCH | Freq: Once | TRANSDERMAL | Status: DC
  Administered 2023-07-09: 1.5 mg via TRANSDERMAL

## 2023-07-09 MED ORDER — FENTANYL CITRATE (PF) 100 MCG/2ML IJ SOLN
INTRAMUSCULAR | Status: DC | PRN
  Administered 2023-07-09: 15 ug via INTRATHECAL

## 2023-07-09 MED ORDER — ZOLPIDEM TARTRATE 5 MG PO TABS: 5.0000 mg | ORAL_TABLET | Freq: Every evening | ORAL | Status: DC | PRN

## 2023-07-09 MED ORDER — MORPHINE SULFATE (PF) 0.5 MG/ML IJ SOLN
INTRAMUSCULAR | Status: AC
  Filled 2023-07-09: qty 10

## 2023-07-09 MED ORDER — DEXAMETHASONE SODIUM PHOSPHATE 10 MG/ML IJ SOLN
INTRAMUSCULAR | Status: DC | PRN
  Administered 2023-07-09: 10 mg via INTRAVENOUS

## 2023-07-09 MED ORDER — DIPHENHYDRAMINE HCL 25 MG PO CAPS
25.0000 mg | ORAL_CAPSULE | ORAL | Status: DC | PRN
  Administered 2023-07-10: 25 mg via ORAL
  Filled 2023-07-09 (×2): qty 1

## 2023-07-09 MED ORDER — KETOROLAC TROMETHAMINE 30 MG/ML IJ SOLN
30.0000 mg | Freq: Four times a day (QID) | INTRAMUSCULAR | Status: AC
  Administered 2023-07-09 – 2023-07-10 (×4): 30 mg via INTRAVENOUS
  Filled 2023-07-09 (×4): qty 1

## 2023-07-09 SURGICAL SUPPLY — 39 items
APL PRP STRL LF DISP 70% ISPRP (MISCELLANEOUS) ×2
APL SKNCLS STERI-STRIP NONHPOA (GAUZE/BANDAGES/DRESSINGS) ×1
BENZOIN TINCTURE PRP APPL 2/3 (GAUZE/BANDAGES/DRESSINGS) IMPLANT
CHLORAPREP W/TINT 26 (MISCELLANEOUS) ×4 IMPLANT
CLAMP UMBILICAL CORD (MISCELLANEOUS) ×2 IMPLANT
CLOTH BEACON ORANGE TIMEOUT ST (SAFETY) ×2 IMPLANT
DRSG OPSITE POSTOP 4X10 (GAUZE/BANDAGES/DRESSINGS) ×2 IMPLANT
ELECT REM PT RETURN 9FT ADLT (ELECTROSURGICAL) ×1
ELECTRODE REM PT RTRN 9FT ADLT (ELECTROSURGICAL) ×2 IMPLANT
EXTRACTOR VACUUM KIWI (MISCELLANEOUS) IMPLANT
EXTRACTOR VACUUM M CUP 4 TUBE (SUCTIONS) IMPLANT
GAUZE PAD ABD 7.5X8 STRL (GAUZE/BANDAGES/DRESSINGS) IMPLANT
GAUZE SPONGE 4X4 12PLY STRL LF (GAUZE/BANDAGES/DRESSINGS) IMPLANT
GLOVE BIO SURGEON STRL SZ7 (GLOVE) ×2 IMPLANT
GLOVE BIOGEL PI IND STRL 7.0 (GLOVE) ×2 IMPLANT
GOWN STRL REUS W/TWL LRG LVL3 (GOWN DISPOSABLE) ×4 IMPLANT
KIT ABG SYR 3ML LUER SLIP (SYRINGE) IMPLANT
NDL HYPO 25X5/8 SAFETYGLIDE (NEEDLE) IMPLANT
NEEDLE HYPO 25X5/8 SAFETYGLIDE (NEEDLE)
NS IRRIG 1000ML POUR BTL (IV SOLUTION) ×2 IMPLANT
PACK C SECTION WH (CUSTOM PROCEDURE TRAY) ×2 IMPLANT
PAD OB MATERNITY 4.3X12.25 (PERSONAL CARE ITEMS) ×2 IMPLANT
RTRCTR C-SECT PINK 25CM LRG (MISCELLANEOUS) IMPLANT
STRIP CLOSURE SKIN 1/2X4 (GAUZE/BANDAGES/DRESSINGS) IMPLANT
SUT MNCRL 0 VIOLET CTX 36 (SUTURE) ×4 IMPLANT
SUT PLAIN 0 NONE (SUTURE) IMPLANT
SUT PLAIN 2 0 (SUTURE) ×1
SUT PLAIN ABS 2-0 CT1 27XMFL (SUTURE) IMPLANT
SUT VIC AB 0 CT1 27 (SUTURE) ×2
SUT VIC AB 0 CT1 27XBRD ANBCTR (SUTURE) ×4 IMPLANT
SUT VIC AB 2-0 CT1 27 (SUTURE) ×2
SUT VIC AB 2-0 CT1 TAPERPNT 27 (SUTURE) ×2 IMPLANT
SUT VIC AB 2-0 SH 27 (SUTURE) ×2
SUT VIC AB 2-0 SH 27XBRD (SUTURE) ×4 IMPLANT
SUT VIC AB 4-0 KS 27 (SUTURE) ×2 IMPLANT
SUT VICRYL 0 TIES 12 18 (SUTURE) IMPLANT
TOWEL OR 17X24 6PK STRL BLUE (TOWEL DISPOSABLE) ×2 IMPLANT
TRAY FOLEY W/BAG SLVR 14FR LF (SET/KITS/TRAYS/PACK) IMPLANT
WATER STERILE IRR 1000ML POUR (IV SOLUTION) ×2 IMPLANT

## 2023-07-09 NOTE — Transfer of Care (Signed)
Immediate Anesthesia Transfer of Care Note  Patient: Ariel [name]  Procedure(s) Performed: Repeat CESAREAN SECTION  Patient Location: PACU  Anesthesia Type:Spinal  Level of Consciousness: awake  Airway & Oxygen Therapy: Patient Spontanous Breathing  Post-op Assessment: Report given to RN  Post vital signs: Reviewed and stable  Last Vitals:  Vitals Value Taken Time  BP    Temp    Pulse 73 07/09/23 1537  Resp 18 07/09/23 1537  SpO2 99 % 07/09/23 1537  Vitals shown include unfiled device data.  Last Pain:  Vitals:   07/09/23 1217  TempSrc: Oral         Complications: No notable events documented.

## 2023-07-09 NOTE — Anesthesia Procedure Notes (Signed)
Spinal  Patient location during procedure: OR Start time: 07/09/2023 2:08 PM End time: 07/09/2023 2:11 PM Reason for block: surgical anesthesia Staffing Performed: anesthesiologist  Anesthesiologist: Leilani Able, MD Performed by: Leilani Able, MD Authorized by: Leilani Able, MD   Preanesthetic Checklist Completed: patient identified, IV checked, site marked, risks and benefits discussed, surgical consent, monitors and equipment checked, pre-op evaluation and timeout performed Spinal Block Patient position: sitting Prep: DuraPrep and site prepped and draped Patient monitoring: continuous pulse ox and blood pressure Approach: midline Location: L3-4 Injection technique: single-shot Needle Needle type: Pencan  Needle gauge: 24 G Needle length: 10 cm Needle insertion depth: 6 cm Assessment Sensory level: T4 Events: CSF return

## 2023-07-09 NOTE — Op Note (Addendum)
Cesarean Section Procedure Note   Ariel [name]  07/09/2023 Procedure: Repeat Low transverse cesarean section   Indications:  History of Myomectomy followed by one C/section     Pre-operative Diagnosis: Previous Cesarean Section, History of Myomectomy. 38.5 weeks   Post-operative Diagnosis: Same   Surgeon Shea Evans, MD    Assistants: Arlan Organ CNM   Anesthesia: spinal   Procedure Details:  The patient was seen in the Holding Room. The risks, benefits, complications, treatment options, and expected outcomes were discussed with the patient. The patient concurred with the proposed plan, giving informed consent. identified as [name] and the procedure verified as C-Section Delivery. A Time Out was held and the above information confirmed. 2 gm Ancef given.  After induction of anesthesia, the patient was draped and prepped in the usual sterile manner, foley was draining urine well.  A pfannenstiel incision was made and carried down through the subcutaneous tissue to the fascia. Fascial incision was made and extended transversely. The fascia was separated from the underlying rectus tissue superiorly and inferiorly. The peritoneum was identified and entered. Peritoneal incision was extended longitudinally. Alexis-O retractor placed. The utero-vesical peritoneal reflection was assessed and incised with hysterotomy. Lower segment was very thin, though no window noted.  A low transverse uterine incision was made with minimal effort and digitally extended. Amniotic fluid was clear. Delivered from cephalic presentation was a FEMALE infant with vigorous cry. Apgar scores of 9 at one minute and 9 at five minutes. Loose nuchal cord released at head delivery. Delayed cord clamping done at 1 minute and baby handed to NICU team in attendance. Cord ph was not sent. Cord blood was obtained for evaluation. The placenta was removed Intact and appeared normal. It will be given to parents per  their request.  The uterine outline was normal without any extensions. Both tubes and ovaries appeared normal. No fibroids were palpated and no adhesions noted. The uterine incision was closed with running locked sutures of 0-Monocryl in single layer due to it being thin and risk of bleeding. Marland Kitchen  Hemostasis was observed. Alexis retractor removed. Peritoneal closure done with 2-0 Vicryl.  The fascia was then reapproximated with running sutures of 0Vicryl. The subcuticular closure was performed using 2-0plain gut. The skin was closed with 4-0Vicryl. Steristrips/honeycomb/pressure dressings placed.  Instrument, sponge, and needle counts were correct prior the abdominal closure and were correct at the conclusion of the case.   Findings:Thin lower segment.    Estimated Blood Loss: 150 cc   Total IV Fluids: 2400 ml LR   Urine Output:  400CC OF clear urine  Specimens: cord blood    Complications: no complications  Disposition: PACU - hemodynamically stable.   Maternal Condition: stable   Baby condition / location:  Couplet care / Skin to Skin  Attending Attestation: I performed the procedure.   Signed: Surgeon(s): Shea Evans, MD

## 2023-07-09 NOTE — Anesthesia Preprocedure Evaluation (Signed)
Anesthesia Evaluation  Patient identified by MRN, date of birth, ID band Patient awake    Reviewed: Allergy & Precautions, NPO status , Patient's Chart, lab work & pertinent test results  Airway Mallampati: III  TM Distance: >3 FB Neck ROM: Full    Dental  (+) Teeth Intact, Dental Advisory Given   Pulmonary former smoker   Pulmonary exam normal        Cardiovascular negative cardio ROS Normal cardiovascular exam     Neuro/Psych  PSYCHIATRIC DISORDERS Anxiety Depression       GI/Hepatic negative GI ROS, Neg liver ROS,,,  Endo/Other  Hypothyroidism    Renal/GU   negative genitourinary   Musculoskeletal negative musculoskeletal ROS (+)    Abdominal Normal abdominal exam  (+)   Peds  Hematology negative hematology ROS (+) Plt 232k   Anesthesia Other Findings   Reproductive/Obstetrics (+) Pregnancy Previous myomectomy                              Anesthesia Physical Anesthesia Plan  ASA: 2  Anesthesia Plan: Spinal   Post-op Pain Management: Tylenol PO (pre-op)   Induction: Intravenous  PONV Risk Score and Plan: 3 and Treatment may vary due to age or medical condition, Scopolamine patch - Pre-op, Dexamethasone and Ondansetron  Airway Management Planned: Natural Airway, Simple Face Mask and Nasal Cannula  Additional Equipment: None  Intra-op Plan:   Post-operative Plan:   Informed Consent: I have reviewed the patients History and Physical, chart, labs and discussed the procedure including the risks, benefits and alternatives for the proposed anesthesia with the patient or authorized representative who has indicated his/her understanding and acceptance.       Plan Discussed with: CRNA  Anesthesia Plan Comments:         Anesthesia Quick Evaluation

## 2023-07-10 ENCOUNTER — Encounter (HOSPITAL_COMMUNITY): Payer: Self-pay | Admitting: Obstetrics & Gynecology

## 2023-07-10 LAB — CBC
HCT: 39.4 % (ref 36.0–46.0)
Hemoglobin: 13.4 g/dL (ref 12.0–15.0)
MCH: 28.8 pg (ref 26.0–34.0)
MCHC: 34 g/dL (ref 30.0–36.0)
MCV: 84.5 fL (ref 80.0–100.0)
Platelets: 249 10*3/uL (ref 150–400)
RBC: 4.66 MIL/uL (ref 3.87–5.11)
RDW: 13.8 % (ref 11.5–15.5)
WBC: 23.7 10*3/uL — ABNORMAL HIGH (ref 4.0–10.5)
nRBC: 0 % (ref 0.0–0.2)

## 2023-07-10 NOTE — Social Work (Signed)
CSW received consult for hx of marijuana use.  Referral was screened out due to the following:  ~MOB had no documented substance use after initial prenatal visit/+UPT.  ~MOB had no positive drug screens after initial prenatal visit/+UPT.  ~Baby's UDS is negative.  CSW will monitor CDS results and make report to Child Protective Services if warranted.    MOB was referred for history of depression/anxiety.  * Referral screened out by Clinical Social Worker because none of the following criteria appear to apply:  ~ History of anxiety/depression during this pregnancy, or of post-partum depression following prior delivery.  ~ Diagnosis of anxiety and/or depression within last 3 years OR * MOB's symptoms currently being treated with medication and/or therapy.  Please contact the Clinical Social Worker if needs arise, or by MOB request.  Wende Neighbors, LCSWA Clinical Social Worker [phone]

## 2023-07-10 NOTE — Progress Notes (Addendum)
Subjective: Postpartum Day 1: Elective repeat Low Transverse Cesarean Delivery Patient reports no incisional pain, tolerating PO, and no problems voiding.  Ambulating. Bleeding from dressing   Objective: Vital signs in last 24 hours: Temp:  [97 F (36.1 C)-98.2 F (36.8 C)] 98.2 F (36.8 C) (08/30 0946) Pulse Rate:  [64-87] 64 (08/30 0946) Resp:  [13-20] 18 (08/30 0946) BP: (95-116)/(45-82) 99/57 (08/30 0946) SpO2:  [93 %-100 %] 98 % (08/30 0946) Weight:  [74.8 kg-76.7 kg] 74.8 kg (08/29 1236)  Physical Exam:  General: alert, cooperative, and appears stated age\ CV RRR Lungs CTA  Lochia: appropriate Uterine Fundus: firm Incision: pressure dressing removed. Honeycomb blood stained on right. RN to change, no palpable hematoma or collection  DVT Evaluation: No evidence of DVT seen on physical exam. Negative Homan's sign.     Latest Ref Rng & Units 07/10/2023    5:07 AM 07/07/2023   11:00 AM 10/22/2021    5:07 AM  CBC  WBC 4.0 - 10.5 K/uL 23.7  13.4  14.6   Hemoglobin 12.0 - 15.0 g/dL 52.8  41.3  9.7   Hematocrit 36.0 - 46.0 % 39.4  44.2  29.3   Platelets 150 - 400 K/uL 249  244  175     O + Rub Imm  Assessment/Plan: Status post Cesarean section. Doing well postoperatively.  Continue current care. Newborn circumcision consented and agrees   Robley Fries, MD 07/10/2023, 10:45 AM

## 2023-07-11 MED ORDER — IBUPROFEN 600 MG PO TABS: 600.0000 mg | ORAL_TABLET | Freq: Four times a day (QID) | ORAL | 0 refills | Status: AC

## 2023-07-11 MED ORDER — OXYCODONE HCL 5 MG PO TABS: 5.0000 mg | ORAL_TABLET | ORAL | 0 refills | Status: AC | PRN

## 2023-07-11 NOTE — Lactation Note (Addendum)
This note was copied from a baby's chart. Lactation Consultation Note  Patient Name: Ariel [name] [id number] Date: 07/11/2023 Age:38 hours Reason for consult: Initial assessment;Term  P2, Mother is experienced with breastfeeding and baby is latching well.  Mother's nipples are sore. She has her own All Purpose Nipple Ointment.  Recommend using only if cracked, bleeding or blisters present.  Provided mother with coconut oil.  Baby latched with ease.  Make sure lips are flanged and guide baby to breast in sniffing position.  Frequent swallows observed and heard.  Reviewed engorgement care and monitoring voids/stools.  Maternal Data Has patient been taught Hand Expression?: Yes Does the patient have breastfeeding experience prior to this delivery?: Yes How long did the patient breastfeed?:  (14-15 mos.)  Feeding Mother's Current Feeding Choice: Breast Milk  LATCH Score Latch: Grasps breast easily, tongue down, lips flanged, rhythmical sucking.  Audible Swallowing: Spontaneous and intermittent  Type of Nipple: Everted at rest and after stimulation  Comfort (Breast/Nipple): Filling, red/small blisters or bruises, mild/mod discomfort  Hold (Positioning): Assistance needed to correctly position infant at breast and maintain latch.  LATCH Score: 8   Lactation Tools Discussed/Used Tools: Coconut oil  Interventions Interventions: Breast feeding basics reviewed;Skin to skin;Education;Coconut oil;LC Services brochure  Discharge Discharge Education: Engorgement and breast care;Warning signs for feeding baby  Consult Status Consult Status: Complete    Hardie Pulley 07/11/2023, 8:36 AM

## 2023-07-11 NOTE — Discharge Summary (Signed)
Postpartum Discharge Summary  Date of Service updated     Patient Name: Ariel [name] DOB: 1986-09-24 MRN: [id number]  Date of admission: 07/09/2023 Delivery date:07/09/2023 Delivering provider: MODY, VAISHALI Date of discharge: 07/11/2023  Admitting diagnosis: History of myomectomy [Z98.890] Status post repeat low transverse cesarean section [Z98.891] Intrauterine pregnancy: [redacted]w[redacted]d     Secondary diagnosis:  Principal Problem:   Postpartum care following cesarean delivery 8/29 Active Problems:   History of myomectomy   PAI-1    Hypothyroidism   Anxiety and depression   Status post repeat low transverse cesarean section  Additional problems: none    Discharge diagnosis: Term Pregnancy Delivered                                              Complications: None  Hospital course: Sceduled C/S   37 y.o. yo [id number] at [redacted]w[redacted]d was admitted to the hospital 07/09/2023 for scheduled cesarean section with the following indication:Elective Repeat.Delivery details are as follows:  Membrane Rupture Time/Date: 2:37 PM,07/09/2023  Delivery Method:C-Section, Low Transverse Details of operation can be found in separate operative note.  Patient had a postpartum course complicated bynothing.  She is ambulating, tolerating a regular diet, passing flatus, and urinating well. Patient is discharged home in stable condition on  07/11/23        Newborn Data: Birth date:07/09/2023 Birth time:2:37 PM Gender:Female Living status:Living Apgars:9 ,9  Weight:3950 g     Physical exam  Vitals:   07/10/23 0946 07/10/23 2129 07/11/23 0500 07/11/23 0609  BP: (!) 99/57 111/73  (!) 98/55  Pulse: 64 63  (!) 57  Resp: 18 18  18   Temp: 98.2 F (36.8 C) 97.7 F (36.5 C)  98 F (36.7 C)  TempSrc: Oral Oral  Oral  SpO2: 98% 97% 98%   Weight:      Height:       General: alert, cooperative, and no distress Lochia: appropriate Uterine Fundus: firm Incision: Healing well with no significant drainage DVT  Evaluation: No evidence of DVT seen on physical exam. Labs: Lab Results  Component Value Date   WBC 23.7 (H) 07/10/2023   HGB 13.4 07/10/2023   HCT 39.4 07/10/2023   MCV 84.5 07/10/2023   PLT 249 07/10/2023      Latest Ref Rng & Units 10/04/2018    1:48 PM  CMP  Glucose 70 - 99 mg/dL 147   BUN 6 - 20 mg/dL 16   Creatinine 8.29 - 1.00 mg/dL 5.62   Sodium 130 - 865 mmol/L 140   Potassium 3.5 - 5.1 mmol/L 4.1   Chloride 98 - 111 mmol/L 104   CO2 22 - 32 mmol/L 25   Calcium 8.9 - 10.3 mg/dL 9.5   Total Protein 6.5 - 8.1 g/dL 7.4   Total Bilirubin 0.3 - 1.2 mg/dL 0.5   Alkaline Phos 38 - 126 U/L 56   AST 15 - 41 U/L 18   ALT 0 - 44 U/L 13    Edinburgh Score:    07/10/2023    9:33 AM  Edinburgh Postnatal Depression Scale Screening Tool  I have been able to laugh and see the funny side of things. 0  I have looked forward with enjoyment to things. 0  I have blamed myself unnecessarily when things went wrong. 0  I have been anxious or worried for no good  reason. 0  I have felt scared or panicky for no good reason. 0  Things have been getting on top of me. 0  I have been so unhappy that I have had difficulty sleeping. 0  I have felt sad or miserable. 0  I have been so unhappy that I have been crying. 0  The thought of harming myself has occurred to me. 0  Edinburgh Postnatal Depression Scale Total 0      After visit meds:  Allergies as of 07/11/2023   No Known Allergies      Medication List     STOP taking these medications    valACYclovir 500 MG tablet Commonly known as: VALTREX   vitamin D3 50 MCG (2000 UT) Caps       TAKE these medications    ibuprofen 600 MG tablet Commonly known as: ADVIL Take 1 tablet (600 mg total) by mouth every 6 (six) hours.   levothyroxine 50 MCG tablet Commonly known as: SYNTHROID Take 50 mcg by mouth daily before breakfast.   oxyCODONE 5 MG immediate release tablet Commonly known as: Oxy IR/ROXICODONE Take 1 tablet (5 mg  total) by mouth every 4 (four) hours as needed for moderate pain.   prenatal multivitamin Tabs tablet Take 1 tablet by mouth daily.   PROBIOTIC DAILY PO Take 1 capsule by mouth daily.         Discharge home in stable condition Infant Feeding: Breast Infant Disposition:home with mother Discharge instruction: per After Visit Summary and Postpartum booklet. Activity: Advance as tolerated. Pelvic rest for 6 weeks.  Diet: routine diet Anticipated Birth Control: Unsure Postpartum Appointment:6 weeks Future Appointments:No future appointments. Follow up Visit:  Follow-up Information     Shea Evans, MD Follow up.   Specialty: Obstetrics and Gynecology Contact information: [address] [place] [address] [phone]                     07/11/2023 Vick Frees, MD

## 2023-07-11 NOTE — Progress Notes (Signed)
No c/o; pain mostly controlled but would like something stronger than ibuprofen/tylenol to take off edge, tol po, ambulating Voids w/o diffifulty, +flatus, nml lochia Breastfeeding  Patient Vitals for the past 24 hrs:  BP Temp Temp src Pulse Resp SpO2  07/11/23 0609 (!) 98/55 98 F (36.7 C) Oral (!) 57 18 --  07/11/23 0500 -- -- -- -- -- 98 %  07/10/23 2129 111/73 97.7 F (36.5 C) Oral 63 18 97 %   A&ox3 Rrr Ctab Abd: soft,nt,nd; fundus firm and below umb; nml bs; dressing c/d/I LE: no edema, nt bilat     Latest Ref Rng & Units 07/10/2023    5:07 AM 07/07/2023   11:00 AM 10/22/2021    5:07 AM  CBC  WBC 4.0 - 10.5 K/uL 23.7  13.4  14.6   Hemoglobin 12.0 - 15.0 g/dL 29.5  62.1  9.7   Hematocrit 36.0 - 46.0 % 39.4  44.2  29.3   Platelets 150 - 400 K/uL 249  244  175    A/P: pod2 s/p rltcs Doing well, contin care; encourage narcotic prn and pt agrees; wants d/c home will plan once confirm good pain control and tolerates narcotic Rh pos RI H/o hsv - no current outbreak Hypothyroid - synthroid Breastfeeding, boy; s/p circumcision

## 2023-07-12 NOTE — Anesthesia Postprocedure Evaluation (Signed)
Anesthesia Post Note  Patient: Ariel [name]  Procedure(s) Performed: Repeat CESAREAN SECTION     Patient location during evaluation: PACU Anesthesia Type: Spinal Level of consciousness: awake Pain management: pain level controlled Vital Signs Assessment: post-procedure vital signs reviewed and stable Respiratory status: spontaneous breathing Cardiovascular status: stable Postop Assessment: no headache, no backache, spinal receding and no apparent nausea or vomiting Anesthetic complications: no  No notable events documented.  Last Vitals:  Vitals:   07/11/23 0500 07/11/23 0609  BP:  (!) 98/55  Pulse:  (!) 57  Resp:  18  Temp:  36.7 C  SpO2: 98%     Last Pain:  Vitals:   07/11/23 1405  TempSrc:   PainSc: 0-No pain   Pain Goal:                   Caren Macadam

## 2023-07-17 ENCOUNTER — Encounter (HOSPITAL_COMMUNITY): Payer: Self-pay | Admitting: Obstetrics and Gynecology

## 2023-07-17 ENCOUNTER — Inpatient Hospital Stay (HOSPITAL_COMMUNITY)
Admission: AD | Admit: 2023-07-17 | Discharge: 2023-07-17 | Disposition: A | Discharge status: Home / Self Care | Payer: BC Managed Care – PPO | Attending: Obstetrics and Gynecology | Admitting: Obstetrics and Gynecology

## 2023-07-17 ENCOUNTER — Inpatient Hospital Stay (HOSPITAL_COMMUNITY): Payer: BC Managed Care – PPO

## 2023-07-17 DIAGNOSIS — O165 Unspecified maternal hypertension, complicating the puerperium: Secondary | ICD-10-CM | POA: Insufficient documentation

## 2023-07-17 DIAGNOSIS — R072 Precordial pain: Secondary | ICD-10-CM | POA: Insufficient documentation

## 2023-07-17 DIAGNOSIS — K2900 Acute gastritis without bleeding: Secondary | ICD-10-CM | POA: Insufficient documentation

## 2023-07-17 DIAGNOSIS — Z3A39 39 weeks gestation of pregnancy: Secondary | ICD-10-CM

## 2023-07-17 DIAGNOSIS — O99893 Other specified diseases and conditions complicating puerperium: Secondary | ICD-10-CM | POA: Diagnosis present

## 2023-07-17 LAB — CBC
HCT: 42.3 % (ref 36.0–46.0)
Hemoglobin: 14 g/dL (ref 12.0–15.0)
MCH: 28.6 pg (ref 26.0–34.0)
MCHC: 33.1 g/dL (ref 30.0–36.0)
MCV: 86.3 fL (ref 80.0–100.0)
Platelets: 315 10*3/uL (ref 150–400)
RBC: 4.9 MIL/uL (ref 3.87–5.11)
RDW: 12.8 % (ref 11.5–15.5)
WBC: 12.1 10*3/uL — ABNORMAL HIGH (ref 4.0–10.5)
nRBC: 0 % (ref 0.0–0.2)

## 2023-07-17 LAB — COMPREHENSIVE METABOLIC PANEL
ALT: 19 U/L (ref 0–44)
AST: 18 U/L (ref 15–41)
Albumin: 3.1 g/dL — ABNORMAL LOW (ref 3.5–5.0)
Alkaline Phosphatase: 107 U/L (ref 38–126)
Anion gap: 9 (ref 5–15)
BUN: 17 mg/dL (ref 6–20)
CO2: 24 mmol/L (ref 22–32)
Calcium: 9.4 mg/dL (ref 8.9–10.3)
Chloride: 108 mmol/L (ref 98–111)
Creatinine, Ser: 1.05 mg/dL — ABNORMAL HIGH (ref 0.44–1.00)
GFR, Estimated: >60 mL/min (ref >60)
Glucose, Bld: 95 mg/dL (ref 70–99)
Potassium: 4.6 mmol/L (ref 3.5–5.1)
Sodium: 141 mmol/L (ref 135–145)
Total Bilirubin: 0.5 mg/dL (ref 0.3–1.2)
Total Protein: 6.2 g/dL — ABNORMAL LOW (ref 6.5–8.1)

## 2023-07-17 LAB — TROPONIN I (HIGH SENSITIVITY): Troponin I (High Sensitivity): 4 ng/L (ref <18)

## 2023-07-17 MED ORDER — SUCRALFATE 1 G PO TABS: 1.0000 g | ORAL_TABLET | Freq: Every day | ORAL | 0 refills | Status: AC

## 2023-07-17 MED ORDER — AMLODIPINE BESYLATE 5 MG PO TABS
5.0000 mg | ORAL_TABLET | Freq: Every day | ORAL | Status: DC
  Administered 2023-07-17: 5 mg via ORAL
  Filled 2023-07-17: qty 1

## 2023-07-17 MED ORDER — AMLODIPINE BESYLATE 5 MG PO TABS: 5.0000 mg | ORAL_TABLET | Freq: Every day | ORAL | 0 refills | Status: AC

## 2023-07-17 NOTE — MAU Provider Note (Signed)
Chief Complaint:  Hypertension and Headache  HPI   Event Date/Time   First Provider Initiated Contact with Patient 07/17/23 1820     Ariel [name] is a 37 y.o. [id number] on POD#8 s/p rCS who presents to maternity admissions reporting chest pain since Monday that was dull and achy. On Wednesday night she had an episode of severe mid-sternal chest pain with nausea and a sense of doom. Her mother (a retired Charity fundraiser) encouraged her to come to the hospital but the pain mostly subsided after drinking a Sprite. Has continued to have 5/10 epigastric pain since then, was able to get pepcid today which brought the pain down to a 1-2/10. Has also noted consistently elevated blood pressure since Monday, not severe range but definitely high for her (130s-140s/80s-90s) with one 156/98 durin the severe chest pain event.   Denies headaches, blurred vision or dizziness, no vomiting, normal postpartum vaginal bleeding, no excessive incisional pain.   Pregnancy Course: Received care from [facility], normotensive on discharge from hospital.   Past Medical History:  Diagnosis Date   Anxiety    Chronic kidney disease    Depression    Fibroid    11x7 fibroid removed by Dr. Jeannie Fend   History of kidney stones    History of kidney stones    HSV (herpes simplex virus) infection    Hyperlipidemia    Hypoglycemia    Hypothyroidism    OB History  Gravida Para Term Preterm AB Living  4 2 2   2 2   SAB IAB Ectopic Multiple Live Births  2     0 2    # Outcome Date GA Lbr Len/2nd Weight Sex Type Anes PTL Lv  4 Term 07/09/23 [redacted]w[redacted]d  8 lb 11.3 oz (3.95 kg) M CS-LTranv Spinal  LIV  3 Term 10/21/21 [redacted]w[redacted]d  8 lb 2.5 oz (3.7 kg) M CS-LTranv Spinal  LIV  2 SAB           1 SAB            Past Surgical History:  Procedure Laterality Date   CESAREAN SECTION N/A 10/21/2021   Procedure: Primary CESAREAN SECTION;  Surgeon: Noland Fordyce, MD;  Location: [facility];  Service: Obstetrics;  Laterality: N/A;  EDD: 11/02/21    CESAREAN SECTION N/A 07/09/2023   Procedure: Repeat CESAREAN SECTION;  Surgeon: Shea Evans, MD;  Location: [facility];  Service: Obstetrics;  Laterality: N/A;  EDD: 07/18/23   DILATION AND EVACUATION  03/2020   preformed in Dr office   EXTRACORPOREAL SHOCK WAVE LITHOTRIPSY Right 10/11/2018   Procedure: EXTRACORPOREAL SHOCK WAVE LITHOTRIPSY (ESWL);  Surgeon: Crist Fat, MD;  Location: [facility];  Service: Urology;  Laterality: Right;   GANGLION CYST EXCISION Right 04/21/2017   Procedure: OPEN EXCISION DORSAL ULNAR GANGLION RIGHT WRIST;  Surgeon: Cindee Salt, MD;  Location: [facility];  Service: Orthopedics;  Laterality: Right;   HYSTEROSCOPY N/A 10/10/2020   Procedure: HYSTEROSCOPY, POLYPECTOMY;  Surgeon: Fermin Schwab, MD;  Location[facility];  Service: Gynecology;  Laterality: N/A;   LAPAROSCOPIC GELPORT ASSISTED MYOMECTOMY N/A 10/10/2020   Procedure: LAPAROSCOPIC GELPORT ASSISTED MYOMECTOMY; ABLATION OF ENDOMETRIOSIS; CHROMOTUBATION;  Surgeon: Fermin Schwab, MD;  Location[facility];  Service: Gynecology;  Laterality: N/A;   LEEP     REFRACTIVE SURGERY  2018   wisdom teeth extraction     WRIST ARTHROSCOPY WITH DEBRIDEMENT Right 04/21/2017   Procedure: ARTHROSCOPY RIGHT WRIST with debridement;  Surgeon:  Cindee Salt, MD;  Location: [facility];  Service: Orthopedics;  Laterality: Right;   Family History  Problem Relation Age of Onset   Cancer Father    Heart failure Father    Stroke Maternal Grandmother    Cancer Maternal Grandfather    Heart attack Paternal Grandfather    Social History   Tobacco Use   Smoking status: Former    Average packs/day: 0.3 packs/day for 15.0 years (3.8 ttl pk-yrs)    Types: Cigarettes    Start date: 2006   Smokeless tobacco: Never  Vaping Use   Vaping status: Never Used  Substance Use Topics   Alcohol use: Yes    Comment: minimum to moderate 4-5 drinks per week    Drug  use: Not Currently    Types: Marijuana   No Known Allergies Medications Prior to Admission  Medication Sig Dispense Refill Last Dose   acetaminophen (TYLENOL) 500 MG tablet Take 1,000 mg by mouth every 6 (six) hours as needed.   07/17/2023 at 1700   famotidine (PEPCID) 10 MG tablet Take 10 mg by mouth 2 (two) times daily.   07/17/2023   ibuprofen (ADVIL) 600 MG tablet Take 1 tablet (600 mg total) by mouth every 6 (six) hours. 30 tablet 0 07/17/2023 at 1230   levothyroxine (SYNTHROID) 50 MCG tablet Take 50 mcg by mouth daily before breakfast.   07/17/2023   oxyCODONE (OXY IR/ROXICODONE) 5 MG immediate release tablet Take 1 tablet (5 mg total) by mouth every 4 (four) hours as needed for moderate pain. 20 tablet 0 07/16/2023   Prenatal Vit-Fe Fumarate-FA (PRENATAL MULTIVITAMIN) TABS tablet Take 1 tablet by mouth daily.   07/16/2023   Probiotic Product (PROBIOTIC DAILY PO) Take 1 capsule by mouth daily.   Past Week   I have reviewed patient's Past Medical Hx, Surgical Hx, Family Hx, Social Hx, medications and allergies.   ROS  Pertinent items noted in HPI and remainder of comprehensive ROS otherwise negative.   PHYSICAL EXAM  Patient Vitals for the past 24 hrs:  BP Temp Pulse Resp SpO2 Height Weight  07/17/23 2020 -- -- -- -- 99 % -- --  07/17/23 2015 137/81 -- (!) 56 -- 100 % -- --  07/17/23 2010 -- -- -- -- 98 % -- --  07/17/23 2005 -- -- -- -- 99 % -- --  07/17/23 2000 134/82 -- (!) 59 -- 100 % -- --  07/17/23 1955 -- -- -- -- 100 % -- --  07/17/23 1950 -- -- -- -- 100 % -- --  07/17/23 1945 128/74 -- (!) 59 -- 100 % -- --  07/17/23 1940 -- -- -- -- 99 % -- --  07/17/23 1935 -- -- -- -- 100 % -- --  07/17/23 1930 139/74 -- (!) 51 -- 98 % -- --  07/17/23 1925 -- -- -- -- 100 % -- --  07/17/23 1920 -- -- -- -- 100 % -- --  07/17/23 1915 (!) 146/84 -- (!) 54 -- 100 % -- --  07/17/23 1910 -- -- -- -- 100 % -- --  07/17/23 1905 -- -- -- -- 100 % -- --  07/17/23 1900 136/70 -- (!) 52 -- 99 % -- --   07/17/23 1855 -- -- -- -- 100 % -- --  07/17/23 1850 -- -- -- -- 100 % -- --  07/17/23 1845 (!) 147/78 -- (!) 56 -- 99 % -- --  07/17/23 1840 -- -- -- -- 99 % -- --  07/17/23 1835 -- -- -- -- 98 % -- --  07/17/23 1831 132/72 -- 62 -- -- -- --  07/17/23 1830 -- -- -- -- 100 % -- --  07/17/23 1825 -- -- -- -- 100 % -- --  07/17/23 1820 -- -- -- -- 100 % -- --  07/17/23 1815 -- -- -- -- 100 % -- --  07/17/23 1810 -- -- -- -- 100 % -- --  07/17/23 1805 -- -- -- -- 99 % -- --  07/17/23 1801 129/75 -- 65 -- -- -- --  07/17/23 1800 -- -- -- -- 98 % -- --  07/17/23 1755 -- -- -- -- 98 % -- --  07/17/23 1753 (!) 144/76 -- (!) 55 -- -- -- --  07/17/23 1750 -- -- -- -- 98 % -- --  07/17/23 1745 -- -- -- -- 98 % -- --  07/17/23 1743 138/82 98.4 F (36.9 C) (!) 54 18 100 % -- --  07/17/23 1734 -- -- -- -- -- 5\' 2"  (1.575 m) 159 lb (72.1 kg)   Constitutional: Well-developed, well-nourished female in no acute distress.  Cardiovascular: bradycardic (normal for her) with normal sinus rhythm, warm and well-perfused Respiratory: normal effort, no problems with respiration noted, no cough GI: Abd soft, non-tender, non-distended MS: Extremities nontender, no edema, normal ROM Neurologic: Alert and oriented x 4.  GU: no CVA tenderness Pelvic: exam deferred  Labs: Results for orders placed or performed during the hospital encounter of 07/17/23 (from the past 24 hour(s))  CBC     Status: Abnormal   Collection Time: 07/17/23  6:15 PM  Result Value Ref Range   WBC 12.1 (H) 4.0 - 10.5 K/uL   RBC 4.90 3.87 - 5.11 MIL/uL   Hemoglobin 14.0 12.0 - 15.0 g/dL   HCT 78.2 95.6 - 21.3 %   MCV 86.3 80.0 - 100.0 fL   MCH 28.6 26.0 - 34.0 pg   MCHC 33.1 30.0 - 36.0 g/dL   RDW 08.6 57.8 - 46.9 %   Platelets 315 150 - 400 K/uL   nRBC 0.0 0.0 - 0.2 %  Comprehensive metabolic panel     Status: Abnormal   Collection Time: 07/17/23  6:15 PM  Result Value Ref Range   Sodium 141 135 - 145 mmol/L   Potassium 4.6  3.5 - 5.1 mmol/L   Chloride 108 98 - 111 mmol/L   CO2 24 22 - 32 mmol/L   Glucose, Bld 95 70 - 99 mg/dL   BUN 17 6 - 20 mg/dL   Creatinine, Ser 6.29 (H) 0.44 - 1.00 mg/dL   Calcium 9.4 8.9 - 52.8 mg/dL   Total Protein 6.2 (L) 6.5 - 8.1 g/dL   Albumin 3.1 (L) 3.5 - 5.0 g/dL   AST 18 15 - 41 U/L   ALT 19 0 - 44 U/L   Alkaline Phosphatase 107 38 - 126 U/L   Total Bilirubin 0.5 0.3 - 1.2 mg/dL   GFR, Estimated >41 >32 mL/min   Anion gap 9 5 - 15  Troponin I (High Sensitivity)     Status: None   Collection Time: 07/17/23  6:15 PM  Result Value Ref Range   Troponin I (High Sensitivity) 4 <18 ng/L   Imaging:  DG CHEST PORT 1 VIEW  Result Date: 07/17/2023 CLINICAL DATA:  Chest pain EXAM: PORTABLE CHEST 1 VIEW COMPARISON:  None Available. FINDINGS: No pleural effusion. No pneumothorax. No focal airspace opacity. Normal cardiac and mediastinal contours. No radiographically apparent  displaced rib fractures. Visualized upper abdomen is unremarkable. IMPRESSION: No focal airspace opacity Electronically Signed   By: Lorenza Cambridge M.D.   On: 07/17/2023 20:14    MDM & MAU COURSE  MDM: High  MAU Course: Orders Placed This Encounter  Procedures   DG CHEST PORT 1 VIEW   CBC   Comprehensive metabolic panel   EKG 12-Lead   Discharge patient   Meds ordered this encounter  Medications   amLODipine (NORVASC) tablet 5 mg   amLODipine (NORVASC) 5 MG tablet    Sig: Take 1 tablet (5 mg total) by mouth daily.    Dispense:  30 tablet    Refill:  0   sucralfate (CARAFATE) 1 g tablet    Sig: Take 1 tablet (1 g total) by mouth daily after breakfast. Take until epigastric pain stops.    Dispense:  30 tablet    Refill:  0   Pt calm and breastfeeding on my arrival. Recounted events with history assist from her mother. Reassured both that we would investigate all causes of chest pain. Was not anxious feeling when the pain started, had just finished breastfeeding. Has never had issues with anxiety/nausea  after breastfeeding, so r/o D-MER. Likely gastric in nature given ease with pepcid.  Chest x-ray, EKG, troponin and CBC/CMP normal r/o MI, PE, and PEC. Reassured patient that this is likely gastric and prescribed carafate, she can also continue taking pepcid.   BP elevated several times but not severe range and PEC labs normal. No swelling, will avoid lasix but did prescribe norvasc 5mg  one dose now and then to start daily tomorrow. Advised if BP low or she starts feeling light-headed, can stop taking.  ASSESSMENT   1. Postpartum hypertension   2. Other acute gastritis without hemorrhage    PLAN  Discharge home in stable condition with return precautions.     Follow-up Information     Ob[facility], [facility] Follow up.   Why: as scheduled for postpartum follow up Contact information: [address] [place] [address] [phone]                 Allergies as of 07/17/2023   No Known Allergies      Medication List     TAKE these medications    acetaminophen 500 MG tablet Commonly known as: TYLENOL Take 1,000 mg by mouth every 6 (six) hours as needed.   amLODipine 5 MG tablet Commonly known as: NORVASC Take 1 tablet (5 mg total) by mouth daily.   famotidine 10 MG tablet Commonly known as: PEPCID Take 10 mg by mouth 2 (two) times daily.   ibuprofen 600 MG tablet Commonly known as: ADVIL Take 1 tablet (600 mg total) by mouth every 6 (six) hours.   levothyroxine 50 MCG tablet Commonly known as: SYNTHROID Take 50 mcg by mouth daily before breakfast.   oxyCODONE 5 MG immediate release tablet Commonly known as: Oxy IR/ROXICODONE Take 1 tablet (5 mg total) by mouth every 4 (four) hours as needed for moderate pain.   prenatal multivitamin Tabs tablet Take 1 tablet by mouth daily.   PROBIOTIC DAILY PO Take 1 capsule by mouth daily.   sucralfate 1 g tablet Commonly known as: Carafate Take 1 tablet (1 g total) by mouth daily after breakfast. Take until  epigastric pain stops.       Edd Arbour, CNM, MSN, IBCLC Certified Nurse Midwife, [facility]

## 2023-07-17 NOTE — MAU Note (Signed)
.  Ariel [name] is a 37 y.o. at [redacted]w[redacted]d here in MAU reporting: b/p at home today 130's/90's. Reports b/p on Wednesday 150's/90's. Also states nausea since Wednesday and having chest pain since Monday. Reports the pain in her chest is a heaviness and pressure, and is sternal. States was told by doctor to take pepcid and that did help some but pt feels like something is "not right". Minimal bleeding and breast feeding without problems.  Onset of complaint: 4 days Pain score: 3/10 There were no vitals filed for this visit.   Lab orders placed from triage:

## 2023-08-11 ENCOUNTER — Telehealth (HOSPITAL_COMMUNITY): Payer: Self-pay | Admitting: *Deleted

## 2023-08-11 NOTE — Telephone Encounter (Signed)
08/11/2023  Name: DANESHA KIRCHOFF MRN: [id number] DOB: 10/15/1986  Reason for Call:  Transition of Care Hospital Discharge Call  Contact Status: Patient Contact Status: Complete  Language assistant needed: Interpreter Mode: Interpreter Not Needed        Follow-Up Questions: Do You Have Any Concerns About Your Health As You Heal From Delivery?: No Do You Have Any Concerns About Your Infants Health?: No  Edinburgh Postnatal Depression Scale:  In the Past 7 Days:    PHQ2-9 Depression Scale:     Discharge Follow-up: Edinburgh score requires follow up?:  (Declines screening today.  Had a low score in the hospital and at pediatrician recently.  Feels she is doing well emotionally.) Patient was advised of the following resources:: Breastfeeding Support Group, Support Group (declines postpartum group information via email)  Post-discharge interventions: Reviewed Newborn Safe Sleep Practices  Salena Saner, RN 08/11/2023 15:24

## 2024-02-27 ENCOUNTER — Emergency Department (HOSPITAL_COMMUNITY)
Admission: EM | Admit: 2024-02-27 | Discharge: 2024-02-27 | Disposition: A | Discharge status: Home / Self Care | Attending: Emergency Medicine | Admitting: Emergency Medicine

## 2024-02-27 ENCOUNTER — Other Ambulatory Visit: Payer: Self-pay

## 2024-02-27 DIAGNOSIS — R509 Fever, unspecified: Secondary | ICD-10-CM | POA: Diagnosis not present

## 2024-02-27 DIAGNOSIS — R197 Diarrhea, unspecified: Secondary | ICD-10-CM | POA: Insufficient documentation

## 2024-02-27 LAB — CBC WITH DIFFERENTIAL/PLATELET
Abs Immature Granulocytes: 0.02 10*3/uL (ref 0.00–0.07)
Basophils Absolute: 0 10*3/uL (ref 0.0–0.1)
Basophils Relative: 0 %
Eosinophils Absolute: 0.1 10*3/uL (ref 0.0–0.5)
Eosinophils Relative: 1 %
HCT: 47.5 % — ABNORMAL HIGH (ref 36.0–46.0)
Hemoglobin: 15.6 g/dL — ABNORMAL HIGH (ref 12.0–15.0)
Immature Granulocytes: 0 %
Lymphocytes Relative: 31 %
Lymphs Abs: 2 10*3/uL (ref 0.7–4.0)
MCH: 28.7 pg (ref 26.0–34.0)
MCHC: 32.8 g/dL (ref 30.0–36.0)
MCV: 87.5 fL (ref 80.0–100.0)
Monocytes Absolute: 0.6 10*3/uL (ref 0.1–1.0)
Monocytes Relative: 9 %
Neutro Abs: 3.7 10*3/uL (ref 1.7–7.7)
Neutrophils Relative %: 59 %
Platelets: 168 10*3/uL (ref 150–400)
RBC: 5.43 MIL/uL — ABNORMAL HIGH (ref 3.87–5.11)
RDW: 13 % (ref 11.5–15.5)
WBC: 6.4 10*3/uL (ref 4.0–10.5)
nRBC: 0 % (ref 0.0–0.2)

## 2024-02-27 LAB — COMPREHENSIVE METABOLIC PANEL WITH GFR
ALT: 24 U/L (ref 0–44)
AST: 39 U/L (ref 15–41)
Albumin: 4.4 g/dL (ref 3.5–5.0)
Alkaline Phosphatase: 49 U/L (ref 38–126)
Anion gap: 12 (ref 5–15)
BUN: 16 mg/dL (ref 6–20)
CO2: 18 mmol/L — ABNORMAL LOW (ref 22–32)
Calcium: 9.1 mg/dL (ref 8.9–10.3)
Chloride: 105 mmol/L (ref 98–111)
Creatinine, Ser: 0.77 mg/dL (ref 0.44–1.00)
GFR, Estimated: >60 mL/min (ref >60)
Glucose, Bld: 79 mg/dL (ref 70–99)
Potassium: 4.1 mmol/L (ref 3.5–5.1)
Sodium: 135 mmol/L (ref 135–145)
Total Bilirubin: 1.3 mg/dL — ABNORMAL HIGH (ref 0.0–1.2)
Total Protein: 7.3 g/dL (ref 6.5–8.1)

## 2024-02-27 LAB — RESP PANEL BY RT-PCR (RSV, FLU A&B, COVID)  RVPGX2
Influenza A by PCR: NEGATIVE
Influenza B by PCR: NEGATIVE
Resp Syncytial Virus by PCR: NEGATIVE
SARS Coronavirus 2 by RT PCR: NEGATIVE

## 2024-02-27 LAB — LIPASE, BLOOD: Lipase: 27 U/L (ref 11–51)

## 2024-02-27 LAB — HCG, SERUM, QUALITATIVE: Preg, Serum: NEGATIVE

## 2024-02-27 MED ORDER — SODIUM CHLORIDE 0.9 % IV BOLUS
1000.0000 mL | Freq: Once | INTRAVENOUS | Status: AC
  Administered 2024-02-27: 1000 mL via INTRAVENOUS

## 2024-02-27 MED ORDER — ONDANSETRON HCL 4 MG PO TABS: 4.0000 mg | ORAL_TABLET | Freq: Four times a day (QID) | ORAL | 0 refills | Status: AC

## 2024-02-27 NOTE — ED Provider Notes (Signed)
 [place] [facility]ERGENCY DEPARTMENT AT [facility] Provider Note   CSN: [id number] Arrival date & time: 02/27/24  1232     History  Chief Complaint  Patient presents with   URI    Ariel [name] is a 38 y.o. female.  38 year old female with prior medical history as detailed below presents for evaluation.  Patient complains of 2 to 3 days of diarrhea, elevated heart rate, abdominal cramps, subjective fevers.  Highest temp at home was 99.7.  Patient reports that this is "a high temperature for me".  Patient without vomiting.  Patient reports that her husband travels for work.  He is leaving tomorrow for an extended trip.  She is stuck at home with 2 small children.  She is requesting IV fluids so that she can feel better so that she can take care of her children.  The history is provided by the patient.       Home Medications Prior to Admission medications   Medication Sig Start Date End Date Taking? Authorizing Provider  acetaminophen  (TYLENOL ) 500 MG tablet Take 1,000 mg by mouth every 6 (six) hours as needed.    [provider]  amLODipine  (NORVASC ) 5 MG tablet Take 1 tablet (5 mg total) by mouth daily. 07/17/23   Derick Fleeting, CNM  famotidine  (PEPCID ) 10 MG tablet Take 10 mg by mouth 2 (two) times daily.    [provider]  ibuprofen  (ADVIL ) 600 MG tablet Take 1 tablet (600 mg total) by mouth every 6 (six) hours. 07/11/23   Zora Hires, MD  levothyroxine  (SYNTHROID ) 50 MCG tablet Take 50 mcg by mouth daily before breakfast.    [provider]  oxyCODONE  (OXY IR/ROXICODONE ) 5 MG immediate release tablet Take 1 tablet (5 mg total) by mouth every 4 (four) hours as needed for moderate pain. 07/11/23   Zora Hires, MD  Prenatal Vit-Fe Fumarate-FA (PRENATAL MULTIVITAMIN) TABS tablet Take 1 tablet by mouth daily.    [provider]  Probiotic Product (PROBIOTIC DAILY PO) Take 1 capsule by mouth daily.    [provider]  sucralfate  (CARAFATE ) 1 g tablet Take 1 tablet (1 g total) by mouth daily after breakfast. Take until epigastric pain stops. 07/17/23   Walker, Jamilla R, CNM      Allergies    Patient has no known allergies.    Review of Systems   Review of Systems  All other systems reviewed and are negative.   Physical Exam Updated Vital Signs BP (!) 130/96 (BP Location: Right Arm)   Pulse 78   Temp 98.1 F (36.7 C) (Oral)   Resp 18   Ht 5\' 2"  (1.575 m)   Wt 64 kg   LMP 10/11/2022   SpO2 100%   Breastfeeding Yes   BMI 25.79 kg/m  Physical Exam Vitals and nursing note reviewed.  Constitutional:      General: She is not in acute distress.    Appearance: Normal appearance. She is well-developed.  HENT:     Head: Normocephalic and atraumatic.  Eyes:     Conjunctiva/sclera: Conjunctivae normal.     Pupils: Pupils are equal, round, and reactive to light.  Cardiovascular:     Rate and Rhythm: Normal rate and regular rhythm.     Heart sounds: Normal heart sounds.  Pulmonary:     Effort: Pulmonary effort is normal. No respiratory distress.     Breath sounds: Normal breath sounds.  Abdominal:     General: There  is no distension.     Palpations: Abdomen is soft.     Tenderness: There is no abdominal tenderness.  Musculoskeletal:        General: No deformity. Normal range of motion.     Cervical back: Normal range of motion and neck supple.  Skin:    General: Skin is warm and dry.  Neurological:     General: No focal deficit present.     Mental Status: She is alert and oriented to person, place, and time.     ED Results / Procedures / Treatments   Labs (all labs ordered are listed, but only abnormal results are displayed) Labs Reviewed  RESP PANEL BY RT-PCR (RSV, FLU A&B, COVID)  [id number]  COMPREHENSIVE METABOLIC PANEL WITH GFR  CBC WITH DIFFERENTIAL/PLATELET  HCG, SERUM, QUALITATIVE  URINALYSIS, W/ REFLEX TO CULTURE (INFECTION SUSPECTED)  LIPASE, BLOOD     EKG None  Radiology No results found.  Procedures Procedures    Medications Ordered in ED Medications  sodium chloride  0.9 % bolus 1,000 mL (has no administration in time range)    ED Course/ Medical Decision Making/ A&P                                 Medical Decision Making Amount and/or Complexity of Data Reviewed Labs: ordered.    Medical Screen Complete  This patient presented to the ED with complaint of nausea, diarrhea.  This complaint involves an extensive number of treatment options. The initial differential diagnosis includes, but is not limited to, gastroenteritis, metabolic abnormality, AKI, etc.  This presentation is: Acute, Self-Limited, Previously Undiagnosed, Uncertain Prognosis, Complicated, and Systemic Symptoms  Patient is presenting with constellation of symptoms most consistent with likely viral process.  Screening labs obtained are without significant acute abnormality.  Patient feels much improved after IV fluids.    She is appropriate for discharge home.    Importance of close follow-up stressed patient precautions given and understood.  Co morbidities that complicated the patient's evaluation  See hpi   Additional history obtained:  External records from outside sources obtained and reviewed including prior ED visits and prior Inpatient records.    Problem List / ED Course:  Nausea and vomiting  Disposition:  After consideration of the diagnostic results and the patients response to treatment, I feel that the patent would benefit from close outpatient followup.          Final Clinical Impression(s) / ED Diagnoses Final diagnoses:  Diarrhea, unspecified type    Rx / DC Orders ED Discharge Orders          Ordered    ondansetron  (ZOFRAN ) 4 MG tablet  Every 6 hours        02/27/24 1443              Burnette Carte, MD 02/27/24 1444

## 2024-02-27 NOTE — ED Triage Notes (Signed)
 Patient to ED by POV with c/o URI symptoms. She states for the last 3 days she has had diarrhea, elevated HR, ABD discomfort, chills, body aches, fever. Highest temp at home was 99.7 took Tylenol  OTC daily no relief.

## 2024-02-27 NOTE — ED Notes (Addendum)
 Difficulty starting IV  Ultrasound RN attempting IV

## 2024-02-27 NOTE — Discharge Instructions (Signed)
 Return for any problem.  ?
